# Patient Record
Sex: Male | Born: 1965 | Hispanic: No | Marital: Married | Smoking: Current every day smoker
Health system: Southern US, Community
[De-identification: ages and names within clinical notes are randomized; demographics above are authoritative.]

## PROBLEM LIST (undated history)

## (undated) DIAGNOSIS — I10 Essential (primary) hypertension: Secondary | ICD-10-CM

## (undated) DIAGNOSIS — I639 Cerebral infarction, unspecified: Secondary | ICD-10-CM

## (undated) DIAGNOSIS — E785 Hyperlipidemia, unspecified: Secondary | ICD-10-CM

## (undated) HISTORY — PX: NO PAST SURGERIES: SHX2092

---

## 2011-11-02 ENCOUNTER — Emergency Department (HOSPITAL_COMMUNITY): Payer: PRIVATE HEALTH INSURANCE

## 2011-11-02 ENCOUNTER — Inpatient Hospital Stay (HOSPITAL_COMMUNITY)
Admission: EM | Admit: 2011-11-02 | Discharge: 2011-11-03 | DRG: 066 | Disposition: A | Discharge status: Home / Self Care | Payer: PRIVATE HEALTH INSURANCE | Attending: Internal Medicine | Admitting: Internal Medicine

## 2011-11-02 DIAGNOSIS — Z91199 Patient's noncompliance with other medical treatment and regimen due to unspecified reason: Secondary | ICD-10-CM

## 2011-11-02 DIAGNOSIS — Z7982 Long term (current) use of aspirin: Secondary | ICD-10-CM

## 2011-11-02 DIAGNOSIS — I10 Essential (primary) hypertension: Secondary | ICD-10-CM | POA: Diagnosis present

## 2011-11-02 DIAGNOSIS — Z9119 Patient's noncompliance with other medical treatment and regimen: Secondary | ICD-10-CM

## 2011-11-02 DIAGNOSIS — F17200 Nicotine dependence, unspecified, uncomplicated: Secondary | ICD-10-CM | POA: Diagnosis present

## 2011-11-02 DIAGNOSIS — E119 Type 2 diabetes mellitus without complications: Secondary | ICD-10-CM | POA: Diagnosis present

## 2011-11-02 DIAGNOSIS — F1010 Alcohol abuse, uncomplicated: Secondary | ICD-10-CM | POA: Diagnosis present

## 2011-11-02 DIAGNOSIS — I6350 Cerebral infarction due to unspecified occlusion or stenosis of unspecified cerebral artery: Principal | ICD-10-CM | POA: Diagnosis present

## 2011-11-02 DIAGNOSIS — E785 Hyperlipidemia, unspecified: Secondary | ICD-10-CM | POA: Diagnosis present

## 2011-11-02 DIAGNOSIS — I639 Cerebral infarction, unspecified: Secondary | ICD-10-CM | POA: Diagnosis present

## 2011-11-02 HISTORY — DX: Essential (primary) hypertension: I10

## 2011-11-02 LAB — CBC
MCH: 33.7 pg (ref 26.0–34.0)
MCH: 33.6 pg (ref 26.0–34.0)
MCHC: 36 g/dL (ref 30.0–36.0)
MCHC: 35.6 g/dL (ref 30.0–36.0)
MCV: 93.7 fL (ref 78.0–100.0)
MCV: 94.6 fL (ref 78.0–100.0)
Platelets: 244 10*3/uL (ref 150–400)
Platelets: 262 10*3/uL (ref 150–400)
RDW: 11.7 % (ref 11.5–15.5)

## 2011-11-02 LAB — GLUCOSE, CAPILLARY: Glucose-Capillary: 325 mg/dL — ABNORMAL HIGH (ref 70–99)

## 2011-11-02 LAB — CREATININE, SERUM: Creatinine, Ser: 0.7 mg/dL (ref 0.50–1.35)

## 2011-11-02 LAB — BASIC METABOLIC PANEL
CO2: 27 mEq/L (ref 19–32)
Calcium: 10.7 mg/dL — ABNORMAL HIGH (ref 8.4–10.5)
Creatinine, Ser: 0.67 mg/dL (ref 0.50–1.35)
GFR calc non Af Amer: >90 mL/min (ref >90)
Sodium: 131 mEq/L — ABNORMAL LOW (ref 135–145)

## 2011-11-02 LAB — PROTIME-INR
INR: 0.98 (ref 0.00–1.49)
Prothrombin Time: 13.2 seconds (ref 11.6–15.2)

## 2011-11-02 LAB — COMPREHENSIVE METABOLIC PANEL
AST: 14 U/L (ref 0–37)
Albumin: 4 g/dL (ref 3.5–5.2)
BUN: 10 mg/dL (ref 6–23)
Calcium: 10 mg/dL (ref 8.4–10.5)
Chloride: 95 mEq/L — ABNORMAL LOW (ref 96–112)
Creatinine, Ser: 0.74 mg/dL (ref 0.50–1.35)
GFR calc non Af Amer: >90 mL/min (ref >90)
Total Bilirubin: 0.4 mg/dL (ref 0.3–1.2)

## 2011-11-02 LAB — HEMOGLOBIN A1C
Hgb A1c MFr Bld: 10 % — ABNORMAL HIGH (ref <5.7)
Mean Plasma Glucose: 240 mg/dL — ABNORMAL HIGH (ref <117)

## 2011-11-02 MED ORDER — SODIUM CHLORIDE 0.9 % IV BOLUS (SEPSIS)
1000.0000 mL | Freq: Once | INTRAVENOUS | Status: AC
  Administered 2011-11-02: 1000 mL via INTRAVENOUS

## 2011-11-02 MED ORDER — SODIUM CHLORIDE 0.9 % IJ SOLN: 3.0000 mL | INTRAMUSCULAR | Status: DC | PRN

## 2011-11-02 MED ORDER — SENNOSIDES-DOCUSATE SODIUM 8.6-50 MG PO TABS: 1.0000 | ORAL_TABLET | Freq: Every day | ORAL | Status: DC | PRN

## 2011-11-02 MED ORDER — ENOXAPARIN SODIUM 40 MG/0.4ML ~~LOC~~ SOLN
40.0000 mg | SUBCUTANEOUS | Status: DC
  Administered 2011-11-02: 40 mg via SUBCUTANEOUS
  Filled 2011-11-02 (×2): qty 0.4

## 2011-11-02 MED ORDER — MORPHINE SULFATE 2 MG/ML IJ SOLN: 1.0000 mg | INTRAMUSCULAR | Status: DC | PRN

## 2011-11-02 MED ORDER — SODIUM CHLORIDE 0.9 % IV SOLN
Freq: Once | INTRAVENOUS | Status: AC
  Administered 2011-11-02: 19:00:00 via INTRAVENOUS

## 2011-11-02 MED ORDER — ASPIRIN 325 MG PO TABS
325.0000 mg | ORAL_TABLET | Freq: Every day | ORAL | Status: DC
  Administered 2011-11-03: 325 mg via ORAL
  Filled 2011-11-02: qty 1

## 2011-11-02 MED ORDER — ONDANSETRON HCL 4 MG/2ML IJ SOLN: 4.0000 mg | Freq: Four times a day (QID) | INTRAMUSCULAR | Status: DC | PRN

## 2011-11-02 MED ORDER — ASPIRIN 81 MG PO CHEW
324.0000 mg | CHEWABLE_TABLET | Freq: Once | ORAL | Status: AC
  Administered 2011-11-02: 324 mg via ORAL
  Filled 2011-11-02: qty 4

## 2011-11-02 MED ORDER — ENOXAPARIN SODIUM 30 MG/0.3ML ~~LOC~~ SOLN
30.0000 mg | SUBCUTANEOUS | Status: DC
  Filled 2011-11-02: qty 0.3

## 2011-11-02 MED ORDER — ONDANSETRON HCL 4 MG PO TABS: 4.0000 mg | ORAL_TABLET | Freq: Four times a day (QID) | ORAL | Status: DC | PRN

## 2011-11-02 MED ORDER — HYDROCODONE-ACETAMINOPHEN 5-325 MG PO TABS
1.0000 | ORAL_TABLET | ORAL | Status: DC | PRN
  Administered 2011-11-02: 1 via ORAL
  Filled 2011-11-02: qty 1

## 2011-11-02 MED ORDER — SODIUM CHLORIDE 0.9 % IJ SOLN
3.0000 mL | Freq: Two times a day (BID) | INTRAMUSCULAR | Status: DC
  Administered 2011-11-02 – 2011-11-03 (×2): 3 mL via INTRAVENOUS

## 2011-11-02 MED ORDER — SODIUM CHLORIDE 0.9 % IV SOLN: 250.0000 mL | INTRAVENOUS | Status: DC

## 2011-11-02 NOTE — ED Notes (Signed)
Pt returned from MRI.  Preparing pt to go to yellow zone.

## 2011-11-02 NOTE — ED Notes (Signed)
Pt to MRI via stretcher.  Pt A/O x3, resp equal/nonlabored.  No verbal complaints at this  Time.

## 2011-11-02 NOTE — ED Notes (Signed)
Pt. Arrived via ambulance with a report of rt. Sided weakness that he woke up with.   His wife reports that he went to bed last night at 2300 and he was normal .  He is having weakness rt. Forearm and unsteady gait.  Wife believes his speech is slightly slurred.  Pt. Is alert and oriented X3, denies any pain at present time

## 2011-11-02 NOTE — ED Notes (Signed)
Report received and care assumed.  Pt sitting up in bed with no verbal complaints at this time.  Pt denies vertigo, denies headache and denies N/V.  Wife at bedside.  Awaiting CT.

## 2011-11-02 NOTE — ED Provider Notes (Signed)
Dr Tyron Russell radiology relates patient has a large left lacunar infarct extending into the periventricular white matter, no hemmorrhage noted.  PA Scheinliver notified.  Devoria Albe, MD, Armando Gang   Ward Givens, MD 11/02/11 [phone]

## 2011-11-02 NOTE — ED Provider Notes (Signed)
History     CSN: [id number] Arrival date & time: 11/02/2011  1:34 PM   None     Chief Complaint  Patient presents with  . Weakness    (Consider location/radiation/quality/duration/timing/severity/associated sxs/prior treatment) HPI History provided by pt and his wife.  Pt reports that he woke at 3am to use restroom and noticed weakness in RUE/RLE.  When woke again at 3am, he had slurred speech as well.  His wife noticed that he was unable to lift his coffee cup with his right hand and that he was ataxic.  Pt denies headache, dizziness, blurred vision, ataxia, dyphagia, paresthesias and confusion.  No prior h/o stroke and FH is unknown.  Pt smokes cigarettes, drinks 3-4 shots of liquor a day and has h/o HTN, diabetes and high cholesterol for which he has been non-compliant w/ medications for the past 3 years.   Past Medical History  Diagnosis Date  . Diabetes mellitus   . Hypertension     History reviewed. No pertinent past surgical history.  History reviewed. No pertinent family history.  History  Substance Use Topics  . Smoking status: Current Everyday Smoker -- 0.5 packs/day  . Smokeless tobacco: Not on file  . Alcohol Use: 1.5 oz/week    3 drink(s) per week      Review of Systems  All other systems reviewed and are negative.    Allergies  Review of patient's allergies indicates no known allergies.  Home Medications  No current outpatient prescriptions on file.  BP 174/95  Pulse 102  Temp(Src) 99 F (37.2 C) (Oral)  Resp 16  Ht 5\' 4"  (1.626 m)  Wt 115 lb (52.164 kg)  BMI 19.74 kg/m2  SpO2 100%  Physical Exam  Nursing note and vitals reviewed. Constitutional: He is oriented to person, place, and time. He appears well-developed and well-nourished. No distress.  HENT:  Head: Normocephalic and atraumatic.  Eyes:       Normal appearance  Neck: Normal range of motion.  Cardiovascular: Normal rate, regular rhythm and intact distal pulses.     Pulmonary/Chest: Effort normal and breath sounds normal.  Musculoskeletal: Normal range of motion.  Neurological: He is alert and oriented to person, place, and time. He has normal reflexes. He displays no tremor. No cranial nerve deficit or sensory deficit. He displays a negative Romberg sign. Coordination and gait normal.       4/5 right upper/lower extremity strength.  5/5 LUE/LLE strength.  No past pointing.  No nystagmus.  Pt can not stand independently d/t lack of balance.  Skin: Skin is warm and dry. No rash noted.  Psychiatric: He has a normal mood and affect. His behavior is normal.    ED Course  Procedures (including critical care time)  Labs Reviewed  GLUCOSE, CAPILLARY - Abnormal; Notable for the following:    Glucose-Capillary 325 (*)    All other components within normal limits  BASIC METABOLIC PANEL - Abnormal; Notable for the following:    Sodium 131 (*)    Chloride 93 (*)    Glucose, Bld 295 (*)    Calcium 10.7 (*)    All other components within normal limits  CBC   Ct Head Wo Contrast  11/02/2011  *RADIOLOGY REPORT*  Clinical Data: Right arm and leg weakness and numbness, history diabetes, hypertension  CT HEAD WITHOUT CONTRAST  Technique:  Contiguous axial images were obtained from the base of the skull through the vertex without contrast.  Comparison: Nonethe  Findings: Mild atrophy. Normal  ventricular morphology. No midline shift or mass effect. Question mild periventricular white matter lucency. An area of low attenuation involving the left thalamus extending into periventricular white matter, probably representing developing infarction. No intracranial hemorrhage. Remaining brain parenchyma normal appearance. No extra-axial fluid collection or definite mass lesion. Visualized paranasal sinuses and mastoid air cells clear. No acute osseous findings.  IMPRESSION: Atrophy with minimal white matter hypoattenuation, could represent a small vessel chronic ischemic change.  Probable area of developing infarction involving the left thalamus and periventricular white matter.  Findings called to Dr. Lynelle Doctor on 11/02/2011 at 1621 hours.  Original Report Authenticated By: Lollie Marrow, M.D.     1. Stroke       MDM  45yo M w/ h/o HTN, diabetes, hypercholesterolemia, non-compliant w/ meds, presents w/ right-sided weakness, slurred speech and ataxia. Sx onset 3am today.  Sx have persisted and are evident on exam.  Labs sig for hyperglycemia.  Pt receiving IV fluids.  CT head shows evolving infarct L thalamus.  Results discussed w/ pt.  Neuro called for consult and [facility] for admission.  Pt has received aspirin.          Arie Sabina Mooar, [place] 11/02/11 1644

## 2011-11-02 NOTE — ED Notes (Signed)
cbg is 325

## 2011-11-02 NOTE — ED Notes (Addendum)
Pt has floor bed.  Called floor to give report, spoke with unit clerk, nuerse in report and will call back for report.

## 2011-11-02 NOTE — Consult Note (Signed)
Reason for Consult:stroke   CC: stroke  HPI: Bradley Hall is an 45 y.o. male who presented to emergency after noted slurred speech right facial droop and right leg and arm weakness at 3 am this morning. Initially presented to urgent care this am but was out of window for TPA. Continues to have right sided weakness.  Past Medical History  Diagnosis Date  . Diabetes mellitus   . Hypertension     History reviewed. No pertinent past surgical history.  History reviewed. No pertinent family history.  Social History:  reports that he has been smoking.  He does smoke. He reports that he drinks about 1.5 ounces of alcohol per week. He reports that he does not use illicit drugs.  No Known Allergies  Medications:  Scheduled:   . aspirin  324 mg Oral Once  . sodium chloride  1,000 mL Intravenous Once  . sodium chloride  1,000 mL Intravenous Once    Review of Systems - General ROS: negative for - chills, fatigue, fever or hot flashes Hematological and Lymphatic ROS: negative for - bruising, fatigue, jaundice or pallor Endocrine ROS: negative for - hair pattern changes, hot flashes, mood swings or skin changes Respiratory ROS: negative for - cough, hemoptysis, orthopnea or wheezing Cardiovascular ROS: negative for - dyspnea on exertion, orthopnea, palpitations or shortness of breath Gastrointestinal ROS: negative for - abdominal pain, appetite loss, blood in stools, diarrhea or hematemesis Musculoskeletal ROS: negative for - joint pain, joint stiffness, joint swelling or muscle pain Neurological ROS: right arm and leg weakness Dermatological ROS: negative for dry skin, pruritus and rash   Blood pressure 142/87, pulse 92, temperature 98 F (36.7 C), temperature source Oral, resp. rate 16, height 5\' 2"  (1.575 m), weight 52.164 kg (115 lb), SpO2 100.00%.  Neurologic Examination: Mental Status: Alert, oriented, thought content appropriate.  Knows date is 11-02-2011. Able to follow 3 step  commands without difficulty. Cranial Nerves: II-Visual fields grossly intact. III/IV/VI-Extraocular movements intact.  Pupils reactive bilaterally. V/VII-Smile asymmetric slight facial droop on right VIII-grossly intact IX/X-normal gag XI-bilateral shoulder shrug XII-midline tongue extension Motor: 5- minus grasp, positive right arm drift, 4 deltoid strenght on right.  Right  Hip flexor 4-, knee flexion4-, knee flexion 4  On right.  Mostly proximal weakness on right.  Otherwise 5/5  Sensory: Pinprick and light touch intact throughout, bilaterally Deep Tendon Reflexes: 2+ and no ankle reflexes symmetric throughout Plantars: Downgoing bilaterally Cerebellar: Normal finger-to-nose, normal rapid alternating movements and normal heel-to-shin test.  Patient could not stand without assistance    Results for orders placed during the hospital encounter of 11/02/11 (from the past 48 hour(s))  GLUCOSE, CAPILLARY     Status: Abnormal   Collection Time   11/02/11  1:44 PM      Component Value Range Comment   Glucose-Capillary 325 (*) 70 - 99 (mg/dL)    Comment 1 Notify RN     CBC     Status: Normal   Collection Time   11/02/11  2:59 PM      Component Value Range Comment   WBC 10.3  4.0 - 10.5 (K/uL)    RBC 4.27  4.22 - 5.81 (MIL/uL)    Hemoglobin 14.4  13.0 - 17.0 (g/dL)    HCT 16.1  09.6 - 04.5 (%)    MCV 93.7  78.0 - 100.0 (fL)    MCH 33.7  26.0 - 34.0 (pg)    MCHC 36.0  30.0 - 36.0 (g/dL)    RDW  11.7  11.5 - 15.5 (%)    Platelets 244  150 - 400 (K/uL)   BASIC METABOLIC PANEL     Status: Abnormal   Collection Time   11/02/11  2:59 PM      Component Value Range Comment   Sodium 131 (*) 135 - 145 (mEq/L)    Potassium 4.2  3.5 - 5.1 (mEq/L)    Chloride 93 (*) 96 - 112 (mEq/L)    CO2 27  19 - 32 (mEq/L)    Glucose, Bld 295 (*) 70 - 99 (mg/dL)    BUN 11  6 - 23 (mg/dL)    Creatinine, Ser 1.61  0.50 - 1.35 (mg/dL)    Calcium 09.6 (*) 8.4 - 10.5 (mg/dL)    GFR calc non Af Amer >90  >90  (mL/min)    GFR calc Af Amer >90  >90 (mL/min)     Ct Head Wo Contrast  11/02/2011  *RADIOLOGY REPORT*  Clinical Data: Right arm and leg weakness and numbness, history diabetes, hypertension  CT HEAD WITHOUT CONTRAST  Technique:  Contiguous axial images were obtained from the base of the skull through the vertex without contrast.  Comparison: Nonethe  Findings: Mild atrophy. Normal ventricular morphology. No midline shift or mass effect. Question mild periventricular white matter lucency. An area of low attenuation involving the left thalamus extending into periventricular white matter, probably representing developing infarction. No intracranial hemorrhage. Remaining brain parenchyma normal appearance. No extra-axial fluid collection or definite mass lesion. Visualized paranasal sinuses and mastoid air cells clear. No acute osseous findings.  IMPRESSION: Atrophy with minimal white matter hypoattenuation, could represent a small vessel chronic ischemic change. Probable area of developing infarction involving the left thalamus and periventricular white matter.  Findings called to Dr. Lynelle Doctor on 11/02/2011 at 1621 hours.  Original Report Authenticated By: Lollie Marrow, M.D.     Assessment/Plan: Most likely Left thalamic stroke  Recommend Stroke work up:  -MRI/MRA head no contrast -2 D Echo -Carotid Doppler -Hba1c -FLP -stroke swallow screen -PT/OT/ST -ASA   Felicie Morn PA-C [facility] [phone]  11/02/2011, 4:32 PM

## 2011-11-02 NOTE — ED Notes (Signed)
Called floor to give report, spoke with Pearletha Alfred, RN.  Pt A/O x3, NAD, resp equal/nonlabored.Marland Kitchen

## 2011-11-02 NOTE — H&P (Signed)
PCP:  No primary provider on file.   DOA:  11/02/2011  1:34 PM  Chief Complaint:  Right-sided weakness  HPI: Patient is 45 year old male with no known past medical history except diabetes for which he has not been taking any medicines, presents to [facility] with main concern off right-sided weakness leg worse than arm that started the morning of admission. Patient denies similar episodes in the past and also reports associated slurred speech. He does not have anybody in her family with strokes as far as he knows. He was told at one point that he is diabetic but has not been taking any medicines at home for diabetes control. Patient denies chest pain, shortness of breath, abdominal or urinary concerns, no weakness in other parts of the body, no dizziness, no headaches or visual changes. Patient also denies recent sicknesses or hospitalizations, no fevers chills, no other systemic symptoms.  Allergies: No Known Allergies  Prior to Admission medications   Not on File    Past Medical History  Diagnosis Date  . Diabetes mellitus   . Hypertension     History reviewed. No pertinent past surgical history.  Social History:  reports that he has been smoking.  He does not have any smokeless tobacco history on file. He reports that he drinks about 1.5 ounces of alcohol per week. He reports that he does not use illicit drugs.  History reviewed. No pertinent family history.  Review of Systems:  Constitutional: Denies fever, chills, diaphoresis, appetite change and fatigue.  HEENT: Denies photophobia, eye pain, redness, hearing loss, ear pain, congestion, sore throat, rhinorrhea, sneezing, mouth sores, trouble swallowing, neck pain, neck stiffness and tinnitus.   Respiratory: Denies SOB, DOE, cough, chest tightness,  and wheezing.   Cardiovascular: Denies chest pain, palpitations and leg swelling.  Gastrointestinal: Denies nausea, vomiting, abdominal pain, diarrhea, constipation, blood  in stool and abdominal distention.  Genitourinary: Denies dysuria, urgency, frequency, hematuria, flank pain and difficulty urinating.  Musculoskeletal: Denies myalgias, back pain, joint swelling, arthralgias and gait problem.  Skin: Denies pallor, rash and wound.  Neurological: Denies dizziness, seizures, syncope, light-headedness, numbness and headaches.  Hematological: Denies adenopathy. Easy bruising, personal or family bleeding history  Psychiatric/Behavioral: Denies suicidal ideation, mood changes, confusion, nervousness, sleep disturbance and agitation   Physical Exam:  Filed Vitals:   11/02/11 1326 11/02/11 1340 11/02/11 1459 11/02/11 1532  BP: 172/111 174/95  142/87  Pulse: 96 102  92  Temp:  99 F (37.2 C) 98.6 F (37 C) 98 F (36.7 C)  TempSrc:  Oral  Oral  Resp: 16 16    Height: 5\' 4"  (1.626 m)   5\' 2"  (1.575 m)  Weight: 52.164 kg (115 lb)   52.164 kg (115 lb)  SpO2: 100% 100%  100%    Constitutional: Vital signs reviewed.  Patient is a well-developed and well-nourished in no acute distress and cooperative with exam. Alert and oriented x3.  Head: Normocephalic and atraumatic Ear: TM normal bilaterally Mouth: no erythema or exudates, MMM Eyes: PERRL, EOMI, conjunctivae normal, No scleral icterus.  Neck: Supple, Trachea midline normal ROM, No JVD, mass, thyromegaly, or carotid bruit present.  Cardiovascular: RRR, S1 normal, S2 normal, no MRG, pulses symmetric and intact bilaterally Pulmonary/Chest: CTAB, no wheezes, rales, or rhonchi Abdominal: Soft. Non-tender, non-distended, bowel sounds are normal, no masses, organomegaly, or guarding present.  GU: no CVA tenderness Musculoskeletal: No joint deformities, erythema, or stiffness, ROM full and no nontender Ext: no edema and no cyanosis, pulses palpable  bilaterally (DP and PT) Hematology: no cervical, inginal, or axillary adenopathy.  Neurological: A&O x3, strength on the left side upper and lower extremity 5 out of 5,  3/5 on the right side upper and lower extremity, sensation intact to soft touch throughout bilaterally, DTRs +2 throughout, cranial nerves II through XII grossly intact Skin: Warm, dry and intact. No rash, cyanosis, or clubbing.  Psychiatric: Normal mood and affect. speech and behavior is normal. Judgment and thought content normal. Cognition and memory are normal.   Labs on Admission:  Results for orders placed during the hospital encounter of 11/02/11 (from the past 48 hour(s))  GLUCOSE, CAPILLARY     Status: Abnormal   Collection Time   11/02/11  1:44 PM      Component Value Range Comment   Glucose-Capillary 325 (*) 70 - 99 (mg/dL)    Comment 1 Notify RN     CBC     Status: Normal   Collection Time   11/02/11  2:59 PM      Component Value Range Comment   WBC 10.3  4.0 - 10.5 (K/uL)    RBC 4.27  4.22 - 5.81 (MIL/uL)    Hemoglobin 14.4  13.0 - 17.0 (g/dL)    HCT 16.1  09.6 - 04.5 (%)    MCV 93.7  78.0 - 100.0 (fL)    MCH 33.7  26.0 - 34.0 (pg)    MCHC 36.0  30.0 - 36.0 (g/dL)    RDW 40.9  81.1 - 91.4 (%)    Platelets 244  150 - 400 (K/uL)   BASIC METABOLIC PANEL     Status: Abnormal   Collection Time   11/02/11  2:59 PM      Component Value Range Comment   Sodium 131 (*) 135 - 145 (mEq/L)    Potassium 4.2  3.5 - 5.1 (mEq/L)    Chloride 93 (*) 96 - 112 (mEq/L)    CO2 27  19 - 32 (mEq/L)    Glucose, Bld 295 (*) 70 - 99 (mg/dL)    BUN 11  6 - 23 (mg/dL)    Creatinine, Ser 7.82  0.50 - 1.35 (mg/dL)    Calcium 95.6 (*) 8.4 - 10.5 (mg/dL)    GFR calc non Af Amer >90  >90 (mL/min)    GFR calc Af Amer >90  >90 (mL/min)     Radiological Exams on Admission: No results found.  Assessment/Plan  Principal Problem:  *CVA (cerebral infarction) - this appears to be the first episode of cerebral infarction for patient. Neurology has been consulted. We will perform risk stratification, observe patient in neurology unit on telemetry, will obtain 2-D echo, carotid Dopplers, a fasting lipid  panel, A1c. Will also request PT/OT consult as well as place patient on aspirin. Active Problems:  HTN (hypertension) - monitor blood pressure and initiate medication as indicated.  HLD (hyperlipidemia) - obtain fasting lipid panel and initiate statin  Diabetes mellitus - update A1c and adjust medication regimen as indicated  Alcohol abuse - consult for sensation    Time Spent on Admission: Over 30 minutes  MAGICK-Wm Sahagun 11/02/2011, 5:17 PM

## 2011-11-02 NOTE — ED Provider Notes (Signed)
Medical screening examination/treatment/procedure(s) were performed by non-physician practitioner and as supervising physician I was immediately available for consultation/collaboration.  Raeford Razor, MD 11/02/11 [phone]

## 2011-11-03 LAB — LIPID PANEL: Cholesterol: 296 mg/dL — ABNORMAL HIGH (ref 0–200)

## 2011-11-03 LAB — BASIC METABOLIC PANEL
BUN: 11 mg/dL (ref 6–23)
CO2: 24 mEq/L (ref 19–32)
GFR calc non Af Amer: >90 mL/min (ref >90)
Glucose, Bld: 257 mg/dL — ABNORMAL HIGH (ref 70–99)
Potassium: 4.1 mEq/L (ref 3.5–5.1)
Sodium: 138 mEq/L (ref 135–145)

## 2011-11-03 LAB — CARDIAC PANEL(CRET KIN+CKTOT+MB+TROPI)
CK, MB: 2.8 ng/mL (ref 0.3–4.0)
CK, MB: 2.6 ng/mL (ref 0.3–4.0)
Total CK: 91 U/L (ref 7–232)
Total CK: 73 U/L (ref 7–232)
Troponin I: <0.3 ng/mL (ref <0.30)

## 2011-11-03 LAB — CBC
Hemoglobin: 12.8 g/dL — ABNORMAL LOW (ref 13.0–17.0)
MCH: 33 pg (ref 26.0–34.0)
MCHC: 34.6 g/dL (ref 30.0–36.0)
MCV: 95.4 fL (ref 78.0–100.0)
RBC: 3.88 MIL/uL — ABNORMAL LOW (ref 4.22–5.81)

## 2011-11-03 LAB — PROTIME-INR: Prothrombin Time: 13.6 seconds (ref 11.6–15.2)

## 2011-11-03 LAB — HEMOGLOBIN A1C: Hgb A1c MFr Bld: 9.9 % — ABNORMAL HIGH (ref <5.7)

## 2011-11-03 MED ORDER — HYDROCHLOROTHIAZIDE 25 MG PO TABS: 25.0000 mg | ORAL_TABLET | Freq: Every day | ORAL | Status: DC

## 2011-11-03 MED ORDER — HYDROCODONE-ACETAMINOPHEN 5-325 MG PO TABS: 1.0000 | ORAL_TABLET | ORAL | Status: AC | PRN

## 2011-11-03 MED ORDER — ATORVASTATIN CALCIUM 20 MG PO TABS: 20.0000 mg | ORAL_TABLET | Freq: Every day | ORAL | Status: DC

## 2011-11-03 MED ORDER — ASPIRIN 325 MG PO TABS: 325.0000 mg | ORAL_TABLET | Freq: Every day | ORAL | Status: AC

## 2011-11-03 MED ORDER — METFORMIN HCL 850 MG PO TABS: 850.0000 mg | ORAL_TABLET | Freq: Two times a day (BID) | ORAL | Status: DC

## 2011-11-03 NOTE — Progress Notes (Signed)
Pt to be D/Cd this morning.  He reports no changes in language, word-retrieval, only minor deficits in speech clarity. No formal SLP eval conducted; no SLP f/u warranted.

## 2011-11-03 NOTE — Progress Notes (Signed)
PT/OT/SLP Cancellation Note   Treatment cancelled today due to Discharge from hospital.  Pt declined pt evaluation stating no need. Alferd Apa [phone]

## 2011-11-03 NOTE — Discharge Summary (Signed)
Patient ID: Bradley Hall MRN: [id number] DOB/AGE: 06-28-1966 45 y.o.  Admit date: 11/02/2011 Discharge date: 11/03/2011  Primary Care Physician:  No primary provider on file.  Discharge Diagnoses:    Present on Admission:  .CVA (cerebral infarction) .HTN (hypertension) .HLD (hyperlipidemia) .Diabetes mellitus .Alcohol abuse  Principal Problem:  *CVA (cerebral infarction) Active Problems:  HTN (hypertension)  HLD (hyperlipidemia)  Diabetes mellitus  Alcohol abuse   Current Discharge Medication List    START taking these medications   Details  aspirin 325 MG tablet Take 1 tablet (325 mg total) by mouth daily. Qty: 31 tablet, Refills: 3    atorvastatin (LIPITOR) 20 MG tablet Take 1 tablet (20 mg total) by mouth daily. Qty: 31 tablet, Refills: 3    hydrochlorothiazide (HYDRODIURIL) 25 MG tablet Take 1 tablet (25 mg total) by mouth daily. Qty: 31 tablet, Refills: 3    HYDROcodone-acetaminophen (NORCO) 5-325 MG per tablet Take 1-2 tablets by mouth every 4 (four) hours as needed. Qty: 30 tablet, Refills: 0    metFORMIN (GLUCOPHAGE) 850 MG tablet Take 1 tablet (850 mg total) by mouth 2 (two) times daily with a meal. Qty: 62 tablet, Refills: 5        Disposition and Follow-up: Patient will followup with primary care physician in approximately 2-3 weeks. Followup appointment it will be important to check electrolyte panel as well as assess diabetes controlled with current medication regimen. Patient opts for starting with oral diabetic medications rather than insulin. He was started on 850 mg of metformin twice daily and this needs to be readjusted in outpatient setting if indicated. Diabetes education provided for patient upon discharge. Please note that fasting lipid panel is pending on discharge however patient was started on Lipitor low dose and this can be readjusted as well if indicated. Patient was also instructed to followup with neurology in approximately 4-6 weeks.  Please note that 2-D echo results are pending upon discharge.  Consults: Neurology  Significant Diagnostic Studies:  MRI HEAD:  IMPRESSION:  1. Small acute infarct involving the left thalamus and posterior  limb left internal capsule (left Thalamostriate artery territory).  No mass effect or hemorrhage.  2. Underlying age advanced nonspecific white matter signal  changes.  3. MRA findings are negative for acute events.   Brief H and P: Patient is 45 year old male with no known past medical history except diabetes for which he has not been taking any medicines, presents to [facility] with main concern off right-sided weakness leg worse than arm that started the morning of admission. Patient denies similar episodes in the past and also reports associated slurred speech. He does not have anybody in her family with strokes as far as he knows. He was told at one point that he is diabetic but has not been taking any medicines at home for diabetes control. Patient denies chest pain, shortness of breath, abdominal or urinary concerns, no weakness in other parts of the body, no dizziness, no headaches or visual changes. Patient also denies recent sicknesses or hospitalizations, no fevers chills, no other systemic symptoms.   Physical Exam on Discharge:  Filed Vitals:   11/03/11 0030 11/03/11 0232 11/03/11 0430 11/03/11 0634  BP: 104/69 117/75 116/69 114/72  Pulse: 87 89 101 70  Temp: 98.3 F (36.8 C) 98.4 F (36.9 C) 98.3 F (36.8 C) 98.4 F (36.9 C)  TempSrc:      Resp: 16 16 16 16   Height:      Weight:  SpO2: 100% 99% 96% 96%     Intake/Output Summary (Last 24 hours) at 11/03/11 0913 Last data filed at 11/03/11 0600  Gross per 24 hour  Intake    825 ml  Output    700 ml  Net    125 ml    General: Alert, awake, oriented x3, in no acute distress. HEENT: No bruits, no goiter. Heart: Regular rate and rhythm, without murmurs, rubs, gallops. Lungs: Clear to  auscultation bilaterally. Abdomen: Soft, nontender, nondistended, positive bowel sounds. Extremities: No clubbing cyanosis or edema with positive pedal pulses. Neuro: Patient is alert and oriented, strength 5 out of 5 in upper extremities bilaterally, strength 5 out of 5 in lower extremity on the left side and 4-5 out of 5 in right lower extremity. Her speech improved compared to yesterday but slightly slurred. Otherwise no changes in neurological examination from admission.  CBC:    Component Value Date/Time   WBC 7.4 11/03/2011 0600   HGB 12.8* 11/03/2011 0600   HCT 37.0* 11/03/2011 0600   PLT 244 11/03/2011 0600   MCV 95.4 11/03/2011 0600    Basic Metabolic Panel:    Component Value Date/Time   NA 138 11/03/2011 0600   K 4.1 11/03/2011 0600   CL 101 11/03/2011 0600   CO2 24 11/03/2011 0600   BUN 11 11/03/2011 0600   CREATININE 0.73 11/03/2011 0600   GLUCOSE 257* 11/03/2011 0600   CALCIUM 10.1 11/03/2011 0600    Hospital Course:  Principal Problem:  *CVA (cerebral infarction) - please note the MRI findings above. PT OT and ST was ordered upon admission. Patient desires to go home today and we will arrange outpatient followup with physical therapy continuation and patient was also notified on importance of following up with the neurologist as well as primary care physician. Risk assessment perform and currently fasting lipid panel pending as well as 2-D echo results. Active Problems:  HTN (hypertension) - patient was initially hypertensive but today he remains slightly on the soft side. He was started on HCTZ and will continue to take medication at home. I have emphasized importance of following off with primary care physician to assess blood pressure control and change medication dosing if indicated.  HLD (hyperlipidemia) - I have started the patient on low dose Diprivan since fasting lipid panel is currently pending. I have discussed goal LDL of less than 70 given patient's uncontrolled  diabetes. I have also emphasized importance of following off with primary care physician to ensure that fasting lipid panel is checked regularly and does medication is readjusted as indicated.  Diabetes mellitus - patient has not been on any prior home medications for diabetes control. During hospitalization A1c result = 10. Patient was reluctant to start insulin at this time but is willing to try oral medication. We have started patient on metformin 850 mg tablet twice daily by mouth. Patient has received diabetes education during the hospitalization.  Alcohol abuse - consult was provided for abstinence   Time spent on Discharge: Over 30 minutes  Signed: MAGICK-Annsley Akkerman 11/03/2011, 9:13 AM

## 2011-11-03 NOTE — Progress Notes (Signed)
STROKE TEAM PROGRESS NOTE SUBJECTIVE Anxious for discharge home. Bradley Hall is an 45 y.o. male who presented to emergency after noted slurred speech right facial droop and right leg and arm weakness at 3 am this morning. Initially presented to urgent care this am but was out of window for TPA. Continues to have right sided weakness. He has improved significantly but still has decreased dexterity in the right hand. He has history of diabetes hypertension and smoking but has been noncompliant with his medications  OBJECTIVE Vital signs: Temp: 98.4 F (36.9 C) (11/09 1000) Temp src: Oral (11/09 1000) BP: 147/91 mmHg (11/09 1000) Pulse Rate: 91  (11/09 1000) Respiratory Rate: 16  Intake/Output from previous day: 11/08 0701 - 11/09 0700 In: 825 [I.V.:825] Out: 700 [Urine:700]   Diet: Cardiac, thin liquids  Activity: Up with assistance  DVT Prophylaxis:  Lovenox 40 mg sq daily  Studies: CBC    Component Value Date/Time   WBC 7.4 11/03/2011 0600   RBC 3.88* 11/03/2011 0600   HGB 12.8* 11/03/2011 0600   HCT 37.0* 11/03/2011 0600   PLT 244 11/03/2011 0600   MCV 95.4 11/03/2011 0600   MCH 33.0 11/03/2011 0600   MCHC 34.6 11/03/2011 0600   RDW 12.1 11/03/2011 0600   CMP     Component Value Date/Time   NA 138 11/03/2011 0600   K 4.1 11/03/2011 0600   CL 101 11/03/2011 0600   CO2 24 11/03/2011 0600   GLUCOSE 257* 11/03/2011 0600   BUN 11 11/03/2011 0600   CREATININE 0.73 11/03/2011 0600   CALCIUM 10.1 11/03/2011 0600   PROT 7.6 11/02/2011 1656   ALBUMIN 4.0 11/02/2011 1656   AST 14 11/02/2011 1656   ALT 13 11/02/2011 1656   ALKPHOS 70 11/02/2011 1656   BILITOT 0.4 11/02/2011 1656   GFRNONAA >90 11/03/2011 0600   GFRAA >90 11/03/2011 0600    Lipid Panel     Component Value Date/Time   CHOL 296* 11/03/2011 0600   TRIG 268* 11/03/2011 0600   HDL 42 11/03/2011 0600   CHOLHDL 7.0 11/03/2011 0600   VLDL 54* 11/03/2011 0600   LDLCALC 200* 11/03/2011 0600   hgba1C    Lab Results  Component  Value Date   HGBA1C 9.9* 11/02/2011   HGBA1C 10.0* 11/02/2011    CT of the brain:    MRI of the brain: shows an acute left thalamic and posterior limb internal capsule infarct.  MRA of the brain: no large vessel intracranial stenosis  2D Echocardiogram: pending at this time  Carotid Doppler:  Pending at this time  Physical Exam:   Neurologic Examination:  Mental Status:  Alert, oriented, thought content appropriate.  Able to follow 3 step commands without difficulty.  Cranial Nerves:  II-Visual fields grossly intact.  III/IV/VI-Extraocular movements intact. Pupils reactive bilaterally.  V/VII-Smile asymmetric slight facial droop on right  VIII-grossly intact  IX/X-normal gag  XI-bilateral shoulder shrug  XII-midline tongue extension  Motor: 5- minus grasp, positive right arm drift, 4 deltoid strenght on right. Right Hip flexor 4-, knee flexion4-, knee flexion 4 On right. Mostly proximal weakness on right. Otherwise 5/5 .diminished fine finger movements on the right. Mild finger-to-nose dysmetria the right. Orbits left over right upper extremity Sensory: Pinprick and light touch intact throughout, bilaterally  Deep Tendon Reflexes: 2+ and no ankle reflexes symmetric throughout  Plantars: Downgoing bilaterally  Cerebellar: Normal finger-to-nose, normal rapid alternating movements and normal heel-to-shin test. Patient could not stand without assistance   ASSESSMENT Patient Active  Problem List  Diagnoses  . CVA (cerebral infarction)  . HTN (hypertension)  . HLD (hyperlipidemia)  . Diabetes mellitus  . Alcohol abuse   L thalamic stroke secondary to small vessel disease.multiple vascular risk factors of hypertension, diabetes, smoking and alcoholism.  Hospital day # 2  TREATMENT/PLAN Agree with discharge home. Aggressive risk factor control.aspirin for secondary prevention. Home physical and occupational therapy. Have counseled the patient quit smoking and drinking as well as  be compliant with his medications and risk factor control. Follow up with Dr. Pearlean Brownie in 2 months.   Joaquin Music, ANP-BC, GNP-BC [facility] Pager: [phone] 11/03/2011 10:58 AM

## 2011-11-08 NOTE — Progress Notes (Signed)
PT NEEDS OP PT, FAXED ORDER, REHAB WILL CALL PT WITH APPT TIME.   Bradley Hall 11/08/2011 [phone] OR [phone]

## 2011-11-13 ENCOUNTER — Ambulatory Visit: Payer: PRIVATE HEALTH INSURANCE | Admitting: Rehabilitative and Restorative Service Providers"

## 2011-12-18 ENCOUNTER — Other Ambulatory Visit: Payer: Self-pay | Admitting: Internal Medicine

## 2013-10-02 ENCOUNTER — Inpatient Hospital Stay (HOSPITAL_COMMUNITY)
Admission: EM | Admit: 2013-10-02 | Discharge: 2013-10-03 | DRG: 014 | Disposition: A | Discharge status: Home Health | Payer: BC Managed Care – PPO | Attending: Family Medicine | Admitting: Family Medicine

## 2013-10-02 ENCOUNTER — Emergency Department (HOSPITAL_COMMUNITY): Payer: BC Managed Care – PPO

## 2013-10-02 ENCOUNTER — Encounter (HOSPITAL_COMMUNITY): Payer: Self-pay | Admitting: Emergency Medicine

## 2013-10-02 ENCOUNTER — Inpatient Hospital Stay (HOSPITAL_COMMUNITY): Payer: BC Managed Care – PPO

## 2013-10-02 DIAGNOSIS — E785 Hyperlipidemia, unspecified: Secondary | ICD-10-CM | POA: Diagnosis present

## 2013-10-02 DIAGNOSIS — Z91199 Patient's noncompliance with other medical treatment and regimen due to unspecified reason: Secondary | ICD-10-CM

## 2013-10-02 DIAGNOSIS — F17200 Nicotine dependence, unspecified, uncomplicated: Secondary | ICD-10-CM | POA: Diagnosis present

## 2013-10-02 DIAGNOSIS — E119 Type 2 diabetes mellitus without complications: Secondary | ICD-10-CM

## 2013-10-02 DIAGNOSIS — IMO0001 Reserved for inherently not codable concepts without codable children: Secondary | ICD-10-CM | POA: Diagnosis present

## 2013-10-02 DIAGNOSIS — E876 Hypokalemia: Secondary | ICD-10-CM

## 2013-10-02 DIAGNOSIS — I6350 Cerebral infarction due to unspecified occlusion or stenosis of unspecified cerebral artery: Secondary | ICD-10-CM

## 2013-10-02 DIAGNOSIS — I639 Cerebral infarction, unspecified: Secondary | ICD-10-CM

## 2013-10-02 DIAGNOSIS — R4701 Aphasia: Secondary | ICD-10-CM | POA: Diagnosis present

## 2013-10-02 DIAGNOSIS — F1010 Alcohol abuse, uncomplicated: Secondary | ICD-10-CM | POA: Diagnosis present

## 2013-10-02 DIAGNOSIS — I10 Essential (primary) hypertension: Secondary | ICD-10-CM

## 2013-10-02 DIAGNOSIS — Z9119 Patient's noncompliance with other medical treatment and regimen: Secondary | ICD-10-CM

## 2013-10-02 DIAGNOSIS — R51 Headache: Secondary | ICD-10-CM | POA: Diagnosis not present

## 2013-10-02 DIAGNOSIS — I6340 Cerebral infarction due to embolism of unspecified cerebral artery: Principal | ICD-10-CM | POA: Diagnosis present

## 2013-10-02 DIAGNOSIS — Z8673 Personal history of transient ischemic attack (TIA), and cerebral infarction without residual deficits: Secondary | ICD-10-CM

## 2013-10-02 HISTORY — DX: Hyperlipidemia, unspecified: E78.5

## 2013-10-02 LAB — POCT I-STAT, CHEM 8
BUN: 10 mg/dL (ref 6–23)
Calcium, Ion: 1.21 mmol/L (ref 1.12–1.23)
Chloride: 90 mEq/L — ABNORMAL LOW (ref 96–112)
Creatinine, Ser: 1.2 mg/dL (ref 0.50–1.35)
Glucose, Bld: 235 mg/dL — ABNORMAL HIGH (ref 70–99)
HCT: 46 % (ref 39.0–52.0)
Potassium: 2.4 mEq/L — CL (ref 3.5–5.1)

## 2013-10-02 LAB — ETHANOL: Alcohol, Ethyl (B): <11 mg/dL (ref 0–11)

## 2013-10-02 LAB — DIFFERENTIAL
Basophils Absolute: 0 10*3/uL (ref 0.0–0.1)
Basophils Relative: 0 % (ref 0–1)
Eosinophils Relative: 0 % (ref 0–5)
Lymphocytes Relative: 20 % (ref 12–46)
Neutro Abs: 6.9 10*3/uL (ref 1.7–7.7)

## 2013-10-02 LAB — RAPID URINE DRUG SCREEN, HOSP PERFORMED
Amphetamines: NOT DETECTED
Barbiturates: NOT DETECTED
Cocaine: NOT DETECTED
Tetrahydrocannabinol: NOT DETECTED

## 2013-10-02 LAB — URINALYSIS, ROUTINE W REFLEX MICROSCOPIC
Bilirubin Urine: NEGATIVE
Hgb urine dipstick: NEGATIVE
Ketones, ur: NEGATIVE mg/dL
Nitrite: NEGATIVE
Protein, ur: NEGATIVE mg/dL
Specific Gravity, Urine: 1.008 (ref 1.005–1.030)
Urobilinogen, UA: 0.2 mg/dL (ref 0.0–1.0)

## 2013-10-02 LAB — COMPREHENSIVE METABOLIC PANEL
ALT: 23 U/L (ref 0–53)
AST: 24 U/L (ref 0–37)
Albumin: 4.2 g/dL (ref 3.5–5.2)
CO2: 30 mEq/L (ref 19–32)
Calcium: 9.9 mg/dL (ref 8.4–10.5)
Chloride: 88 mEq/L — ABNORMAL LOW (ref 96–112)
GFR calc non Af Amer: >90 mL/min (ref >90)
Sodium: 132 mEq/L — ABNORMAL LOW (ref 135–145)

## 2013-10-02 LAB — POCT I-STAT TROPONIN I

## 2013-10-02 LAB — GLUCOSE, CAPILLARY: Glucose-Capillary: 240 mg/dL — ABNORMAL HIGH (ref 70–99)

## 2013-10-02 LAB — CBC
Platelets: 230 10*3/uL (ref 150–400)
RDW: 12.7 % (ref 11.5–15.5)
WBC: 10.6 10*3/uL — ABNORMAL HIGH (ref 4.0–10.5)

## 2013-10-02 LAB — APTT: aPTT: 34 seconds (ref 24–37)

## 2013-10-02 LAB — PROTIME-INR: Prothrombin Time: 13 seconds (ref 11.6–15.2)

## 2013-10-02 MED ORDER — ASPIRIN 300 MG RE SUPP
300.0000 mg | Freq: Every day | RECTAL | Status: DC
  Administered 2013-10-02: 300 mg via RECTAL
  Filled 2013-10-02 (×2): qty 1

## 2013-10-02 MED ORDER — HYDROMORPHONE HCL PF 1 MG/ML IJ SOLN
INTRAMUSCULAR | Status: AC
  Administered 2013-10-02: 1 mg via INTRAVENOUS
  Filled 2013-10-02: qty 1

## 2013-10-02 MED ORDER — POTASSIUM CHLORIDE 10 MEQ/100ML IV SOLN
10.0000 meq | INTRAVENOUS | Status: AC
  Administered 2013-10-03 (×4): 10 meq via INTRAVENOUS
  Filled 2013-10-02 (×4): qty 100

## 2013-10-02 MED ORDER — ACETAMINOPHEN 650 MG RE SUPP: 650.0000 mg | RECTAL | Status: DC | PRN

## 2013-10-02 MED ORDER — ENOXAPARIN SODIUM 40 MG/0.4ML ~~LOC~~ SOLN
40.0000 mg | SUBCUTANEOUS | Status: DC
  Administered 2013-10-03: 40 mg via SUBCUTANEOUS
  Filled 2013-10-02 (×2): qty 0.4

## 2013-10-02 MED ORDER — ACETAMINOPHEN 325 MG PO TABS: 650.0000 mg | ORAL_TABLET | ORAL | Status: DC | PRN

## 2013-10-02 MED ORDER — ONDANSETRON HCL 4 MG/2ML IJ SOLN
4.0000 mg | Freq: Once | INTRAMUSCULAR | Status: AC
  Administered 2013-10-02: 4 mg via INTRAVENOUS
  Filled 2013-10-02: qty 2

## 2013-10-02 MED ORDER — LABETALOL HCL 5 MG/ML IV SOLN: 10.0000 mg | INTRAVENOUS | Status: DC | PRN

## 2013-10-02 MED ORDER — POTASSIUM CHLORIDE 10 MEQ/100ML IV SOLN
10.0000 meq | Freq: Once | INTRAVENOUS | Status: AC
  Administered 2013-10-02: 10 meq via INTRAVENOUS
  Filled 2013-10-02: qty 100

## 2013-10-02 MED ORDER — PANTOPRAZOLE SODIUM 40 MG IV SOLR
40.0000 mg | Freq: Once | INTRAVENOUS | Status: AC
  Administered 2013-10-02: 40 mg via INTRAVENOUS
  Filled 2013-10-02: qty 40

## 2013-10-02 MED ORDER — ONDANSETRON HCL 4 MG/2ML IJ SOLN: 4.0000 mg | Freq: Four times a day (QID) | INTRAMUSCULAR | Status: DC | PRN

## 2013-10-02 MED ORDER — INSULIN ASPART 100 UNIT/ML ~~LOC~~ SOLN
0.0000 [IU] | Freq: Three times a day (TID) | SUBCUTANEOUS | Status: DC
  Administered 2013-10-03 (×3): 2 [IU] via SUBCUTANEOUS

## 2013-10-02 MED ORDER — MORPHINE SULFATE 2 MG/ML IJ SOLN
2.0000 mg | INTRAMUSCULAR | Status: DC | PRN
  Administered 2013-10-02: 2 mg via INTRAVENOUS
  Filled 2013-10-02: qty 1

## 2013-10-02 NOTE — ED Notes (Signed)
Pt placed on monitor, continuous pulse oximetry and blood pressure cuff; vitals and EKG being performed

## 2013-10-02 NOTE — ED Notes (Signed)
Pt speech improved. Pt able to tell nurse he is having heartburn. MD notified.

## 2013-10-02 NOTE — ED Provider Notes (Signed)
Aryka Coonradt S 8:00 PM patient discussed in sign out.  Presenting with stroke symptoms already evaluated by neurology. Plan for admission for further stroke workup.  8:50PM Spoke with family practice resident. They will see patient and admit to telemetry bed under attending Dr. Rose Fillers.  8:55 PM nurse informing patient now having some vomiting. Will order Protonix and Zofran.  Angus Seller, PA-C 10/02/13 2055

## 2013-10-02 NOTE — ED Notes (Signed)
Patient lab results was reported to Nurse Reita Cliche.

## 2013-10-02 NOTE — ED Notes (Signed)
Pt. arrived with spouse reports aphasia with left facial droop onset 12 noon today , pt. has slurred speech at arrival with slight left facial droop.

## 2013-10-02 NOTE — ED Provider Notes (Addendum)
CSN: [id number]     Arrival date & time 10/02/13  1802 History   First MD Initiated Contact with Patient 10/02/13 1823     Chief Complaint  Patient presents with  . Aphasia  . Facial Droop   (Consider location/radiation/quality/duration/timing/severity/associated sxs/prior Treatment) The history is provided by the patient and the spouse.   patient's 47 years old. Onset of difficulty speaking at 12 noon some questionable left facial droop. Patient's spouse noted no facial changes though patient has had a history of a prior stroke has complicating factors or risk factors to include diabetes hypertension hyperlipidemia. Patient arrived was triaged was in code stroke was paged. On examination indeed the room patient was alert able to follow commands but had an expressive aphasia. Patient also with a para complaint of some pain behind both eyes and a headache. Patient was taken immediately to the CT scan her. Patient with a history of prior stroke. No significant deficit following that.  Past Medical History  Diagnosis Date  . Diabetes mellitus   . Hypertension   . Hyperlipidemia    Past Surgical History  Procedure Laterality Date  . No past surgeries     No family history on file. History  Substance Use Topics  . Smoking status: Current Every Day Smoker -- 0.50 packs/day  . Smokeless tobacco: Not on file  . Alcohol Use: 1.5 oz/week    3 drink(s) per week    Review of Systems  Constitutional: Negative for fever.  HENT: Negative for congestion.   Eyes: Positive for pain. Negative for redness.  Respiratory: Negative for shortness of breath.   Cardiovascular: Negative for chest pain.  Gastrointestinal: Negative for nausea, vomiting and abdominal pain.  Genitourinary: Negative for dysuria.  Musculoskeletal: Negative for back pain.  Skin: Negative for rash.  Neurological: Positive for speech difficulty and headaches. Negative for seizures, weakness and numbness.  Hematological: Does  not bruise/bleed easily.  Psychiatric/Behavioral: Negative for confusion.    Allergies  Review of patient's allergies indicates no known allergies.  Home Medications   Current Outpatient Rx  Name  Route  Sig  Dispense  Refill  . EXPIRED: atorvastatin (LIPITOR) 20 MG tablet   Oral   Take 1 tablet (20 mg total) by mouth daily.   31 tablet   3   . EXPIRED: hydrochlorothiazide (HYDRODIURIL) 25 MG tablet   Oral   Take 1 tablet (25 mg total) by mouth daily.   31 tablet   3   . EXPIRED: metFORMIN (GLUCOPHAGE) 850 MG tablet   Oral   Take 1 tablet (850 mg total) by mouth 2 (two) times daily with a meal.   62 tablet   5    BP 191/99  Pulse 96  Temp(Src) 99 F (37.2 C) (Oral)  Resp 15  SpO2 100% Physical Exam  Nursing note and vitals reviewed. Constitutional: He is oriented to person, place, and time. He appears well-developed and well-nourished.  HENT:  Head: Normocephalic and atraumatic.  Mouth/Throat: Oropharynx is clear and moist.  Eyes: Conjunctivae and EOM are normal. Pupils are equal, round, and reactive to light.  Neck: Normal range of motion.  Cardiovascular: Normal rate, regular rhythm and normal heart sounds.   No murmur heard. Pulmonary/Chest: Effort normal and breath sounds normal. No respiratory distress.  Abdominal: Soft. Bowel sounds are normal. There is no tenderness.  Musculoskeletal: Normal range of motion. He exhibits no edema.  Neurological: He is alert and oriented to person, place, and time. A cranial nerve deficit  is present. He exhibits normal muscle tone. Coordination normal.  No focal neural deficit other than expressive aphasia. Patient understands commands fine is able to follow them.  Skin: Skin is warm. No rash noted.    ED Course  Procedures (including critical care time) Labs Review Labs Reviewed  GLUCOSE, CAPILLARY - Abnormal; Notable for the following:    Glucose-Capillary 240 (*)    All other components within normal limits  POCT  I-STAT, CHEM 8 - Abnormal; Notable for the following:    Potassium 2.4 (*)    Chloride 90 (*)    Glucose, Bld 235 (*)    All other components within normal limits  ETHANOL  PROTIME-INR  APTT  CBC  DIFFERENTIAL  COMPREHENSIVE METABOLIC PANEL  TROPONIN I  URINE RAPID DRUG SCREEN (HOSP PERFORMED)  URINALYSIS, ROUTINE W REFLEX MICROSCOPIC   Results for orders placed during the hospital encounter of 10/02/13  GLUCOSE, CAPILLARY      Result Value Range   Glucose-Capillary 240 (*) 70 - 99 mg/dL  POCT I-STAT, CHEM 8      Result Value Range   Sodium 135  135 - 145 mEq/L   Potassium 2.4 (*) 3.5 - 5.1 mEq/L   Chloride 90 (*) 96 - 112 mEq/L   BUN 10  6 - 23 mg/dL   Creatinine, Ser 7.82  0.50 - 1.35 mg/dL   Glucose, Bld 956 (*) 70 - 99 mg/dL   Calcium, Ion 2.13  0.86 - 1.23 mmol/L   TCO2 29  0 - 100 mmol/L   Hemoglobin 15.6  13.0 - 17.0 g/dL   HCT 57.8  46.9 - 62.9 %   Comment NOTIFIED PHYSICIAN      Imaging Review Ct Head Wo Contrast  10/02/2013   CLINICAL DATA:  Code stroke, a aphasia and left facial droop  EXAM: CT HEAD WITHOUT CONTRAST  TECHNIQUE: Contiguous axial images were obtained from the base of the skull through the vertex without intravenous contrast.  COMPARISON:  Prior CT head 11/02/2011; prior MRI brain 11/02/2011  FINDINGS: Negative for acute intracranial hemorrhage, acute infarction, mass, mass effect, hydrocephalus or midline shift. Gray-white differentiation is preserved throughout. A well-circumscribed hypoattenuation centered in the posterior left basal ganglia consistent with evolution of a prior infarct. Global cerebral volume loss appears advanced for age. Mild periventricular white matter hypoattenuation most consistent with a sequela of longstanding microvascular ischemic disease. Globes and orbits are intact and unremarkable. No acute soft tissue or calvarial abnormality. Normal aeration of the mastoid air cells and paranasal sinuses. Trace atherosclerotic  calcification in the bilateral cavernous carotid arteries.  IMPRESSION: 1. No acute intracranial abnormality. 2. Remote lacunar infarct in the left basal ganglia 3. Atherosclerotic calcifications in the bilateral cavernous carotid arteries.   Electronically Signed   By: Malachy Moan M.D.   On: 10/02/2013 18:23    EKG Interpretation   None      CRITICAL CARE Performed by: Shelda Jakes. Total critical care time: 30 Critical care time was exclusive of separately billable procedures and treating other patients. Critical care was necessary to treat or prevent imminent or life-threatening deterioration. Critical care was time spent personally by me on the following activities: development of treatment plan with patient and/or surrogate as well as nursing, discussions with consultants, evaluation of patient's response to treatment, examination of patient, obtaining history from patient or surrogate, ordering and performing treatments and interventions, ordering and review of laboratory studies, ordering and review of radiographic studies, pulse oximetry and re-evaluation of patient's  condition.  MDM   1. CVA (cerebral infarction)   2. Hypokalemia    Patient with onset of expressive aphasia at 12 noon today. Patient's spouse was present witnessed the whole thing. Has had a stroke in the past. A delayed film coming in because patient did not want to come in. Patient able to understand and follow commands fine. Here in the ED no evidence of any arm or leg weakness or any facial weakness. The patient was made a code stroke. Head CT without acute findings. On-call neurology involved. They will admit the patient.  Patient's potassium significantly low at 2.4 will require IV potassium for correction. Patient's potassium 10 mEq ordered x2 IV piggyback.     Shelda Jakes, MD 10/02/13 1610  Shelda Jakes, MD 10/02/13 Barry Brunner  Shelda Jakes, MD 10/02/13 [phone]

## 2013-10-02 NOTE — ED Notes (Signed)
Paged Code Stroke 

## 2013-10-02 NOTE — Consult Note (Signed)
Referring Physician: ED    Chief Complaint: CODE STROKE: APHASIA  HPI:                                                                                                                                         Bradley Hall is an 47 y.o. male, right handed, with a past medical history significant for HTN, DM type 2, hyperlipidemia, heavy smoker, stroke 2012, comes in today due to language impairment. He is accompanied by his wife. Last known well at 12 pm today, when he complained of having a severe HA in the right side of the head and then his wife noticed that he was having difficulty expressing himself. She tells me that she was having a hard time understanding what he was trying to say, " it is like he is taking in a different language". Did not notice face droopiness, imbalance, or focal weakness. He is not taking his aspirin as he was supposed to and continues smoking. NIHSS 3 CT brain showed no acute intracranial abnormality.  Date last known well:  Time last known well:  tPA Given: no, late presentation NIHSS: 4 MRS: 3  Past Medical History  Diagnosis Date  . Diabetes mellitus   . Hypertension   . Hyperlipidemia     Past Surgical History  Procedure Laterality Date  . No past surgeries      No family history on file. Social History:  reports that he has been smoking.  He does not have any smokeless tobacco history on file. He reports that he drinks about 1.5 ounces of alcohol per week. He reports that he does not use illicit drugs.  Allergies: No Known Allergies  Medications:                                                                                                                           I have reviewed the patient's current medications.  ROS:  Unable to obtain due to aphasia.  History obtained from unobtainable from patient due to  aphasia. Wife provided history.    Physical exam: pleasant male in no apparent distress. Blood pressure 191/99, pulse 96, temperature 99 F (37.2 C), temperature source Oral, resp. rate 15, SpO2 100.00%. Head: normocephalic. Neck: supple, no bruits, no JVD. Cardiac: no murmurs. Lungs: clear. Abdomen: soft, no tender, no mass. Extremities: no edema.  Neurologic Examination:                                                                                                      Mental Status: Alert, oriented, thought content appropriate.  Expressive greater than receptive dysphasia.  Cranial Nerves: II: Discs flat bilaterally; Visual fields grossly normal, pupils equal, round, reactive to light and accommodation III,IV, VI: ptosis not present, extra-ocular motions intact bilaterally V,VII: smile symmetric, facial light touch sensation normal bilaterally VIII: hearing normal bilaterally IX,X: gag reflex present XI: bilateral shoulder shrug XII: midline tongue extension Motor: Right : Upper extremity   5/5    Left:     Upper extremity   5/5  Lower extremity   5/5     Lower extremity   5/5 Tone and bulk:normal tone throughout; no atrophy noted Sensory: Pinprick and light touch intact throughout, bilaterally Deep Tendon Reflexes:  Right: Upper Extremity   Left: Upper extremity   biceps (C-5 to C-6) 2/4   biceps (C-5 to C-6) 2/4 tricep (C7) 2/4    triceps (C7) 2/4 Brachioradialis (C6) 2/4  Brachioradialis (C6) 2/4  Lower Extremity Lower Extremity  quadriceps (L-2 to L-4) 2/4   quadriceps (L-2 to L-4) 2/4 Achilles (S1) 2/4   Achilles (S1) 2/4  Plantars: Right: upgoing   Left: upgoing Cerebellar: normal finger-to-nose,  normal heel-to-shin test Gait: No ataxia. CV: pulses palpable throughout    Results for orders placed during the hospital encounter of 10/02/13 (from the past 48 hour(s))  GLUCOSE, CAPILLARY     Status: Abnormal   Collection Time    10/02/13  6:36 PM       Result Value Range   Glucose-Capillary 240 (*) 70 - 99 mg/dL   Ct Head Wo Contrast  10/02/2013   CLINICAL DATA:  Code stroke, a aphasia and left facial droop  EXAM: CT HEAD WITHOUT CONTRAST  TECHNIQUE: Contiguous axial images were obtained from the base of the skull through the vertex without intravenous contrast.  COMPARISON:  Prior CT head 11/02/2011; prior MRI brain 11/02/2011  FINDINGS: Negative for acute intracranial hemorrhage, acute infarction, mass, mass effect, hydrocephalus or midline shift. Gray-white differentiation is preserved throughout. A well-circumscribed hypoattenuation centered in the posterior left basal ganglia consistent with evolution of a prior infarct. Global cerebral volume loss appears advanced for age. Mild periventricular white matter hypoattenuation most consistent with a sequela of longstanding microvascular ischemic disease. Globes and orbits are intact and unremarkable. No acute soft tissue or calvarial abnormality. Normal aeration of the mastoid air cells and paranasal sinuses. Trace atherosclerotic calcification in the bilateral cavernous carotid arteries.  IMPRESSION: 1. No acute intracranial abnormality. 2. Remote lacunar infarct in the left basal ganglia 3.  Atherosclerotic calcifications in the bilateral cavernous carotid arteries.   Electronically Signed   By: Malachy Moan M.D.   On: 10/02/2013 18:23       Assessment: 47 y.o. male with acute dysphasia. NIHSS 3. He is out of the window for IV thrombolysis. Will admit to medicine. Aspirin. Complete stroke work up.  Stroke Risk Factors - HTN, DM type 2, hyperlipidemia, heavy smoker, stroke  Plan: 1. HgbA1c, fasting lipid panel 2. MRI, MRA  of the brain without contrast 3. Echocardiogram 4. Carotid dopplers 5. Prophylactic therapy-aspirin 81 mg daily 6. Risk factor modification 7. Telemetry monitoring 8. Frequent neuro checks 9. PT/OT SLP  Wyatt Portela ,MD [facility] [phone]  10/02/2013, 6:42 PM

## 2013-10-02 NOTE — ED Notes (Signed)
When nurse at bedside, patient sat up to talk on phone and threw up brownish orangish emesis. Md notified, pt states he is having heartburn and hungry. Orders obtained. Medicated at this time.

## 2013-10-02 NOTE — ED Notes (Signed)
Pt shirt, shoes, phone placed in pt belongings bag. PT glasses on self. Pt still asking for food. Pt told he cannot eat at this time.

## 2013-10-02 NOTE — ED Notes (Signed)
Internal Med at bedside evaluating patient.

## 2013-10-02 NOTE — ED Notes (Signed)
MD aware of high blood pressures.

## 2013-10-03 ENCOUNTER — Inpatient Hospital Stay (HOSPITAL_COMMUNITY): Payer: BC Managed Care – PPO

## 2013-10-03 DIAGNOSIS — I6789 Other cerebrovascular disease: Secondary | ICD-10-CM

## 2013-10-03 LAB — BASIC METABOLIC PANEL
Calcium: 9.8 mg/dL (ref 8.4–10.5)
Creatinine, Ser: 0.76 mg/dL (ref 0.50–1.35)
GFR calc Af Amer: >90 mL/min (ref >90)
GFR calc non Af Amer: >90 mL/min (ref >90)
Potassium: 3.8 mEq/L (ref 3.5–5.1)
Sodium: 133 mEq/L — ABNORMAL LOW (ref 135–145)

## 2013-10-03 LAB — TSH: TSH: 0.477 u[IU]/mL (ref 0.350–4.500)

## 2013-10-03 LAB — HEMOGLOBIN A1C: Mean Plasma Glucose: 186 mg/dL — ABNORMAL HIGH (ref <117)

## 2013-10-03 LAB — GLUCOSE, CAPILLARY
Glucose-Capillary: 183 mg/dL — ABNORMAL HIGH (ref 70–99)
Glucose-Capillary: 176 mg/dL — ABNORMAL HIGH (ref 70–99)

## 2013-10-03 LAB — CREATININE, SERUM
Creatinine, Ser: 0.67 mg/dL (ref 0.50–1.35)
GFR calc non Af Amer: >90 mL/min (ref >90)

## 2013-10-03 LAB — CBC
HCT: 39.7 % (ref 39.0–52.0)
MCH: 34.7 pg — ABNORMAL HIGH (ref 26.0–34.0)
MCHC: 36.8 g/dL — ABNORMAL HIGH (ref 30.0–36.0)
Platelets: 246 10*3/uL (ref 150–400)
RDW: 12.8 % (ref 11.5–15.5)
WBC: 11.2 10*3/uL — ABNORMAL HIGH (ref 4.0–10.5)

## 2013-10-03 LAB — LIPID PANEL
Cholesterol: 265 mg/dL — ABNORMAL HIGH (ref 0–200)
HDL: 57 mg/dL (ref >39)
Triglycerides: 115 mg/dL (ref <150)
VLDL: 23 mg/dL (ref 0–40)

## 2013-10-03 MED ORDER — ATORVASTATIN CALCIUM 80 MG PO TABS: 80.0000 mg | ORAL_TABLET | Freq: Every day | ORAL | Status: DC

## 2013-10-03 MED ORDER — ASPIRIN EC 81 MG PO TBEC: 81.0000 mg | DELAYED_RELEASE_TABLET | Freq: Every day | ORAL | Status: DC

## 2013-10-03 MED ORDER — INFLUENZA VAC SPLIT QUAD 0.5 ML IM SUSP: 0.5000 mL | INTRAMUSCULAR | Status: DC

## 2013-10-03 MED ORDER — ATORVASTATIN CALCIUM 40 MG PO TABS
40.0000 mg | ORAL_TABLET | Freq: Every day | ORAL | Status: DC
  Filled 2013-10-03: qty 1

## 2013-10-03 NOTE — Progress Notes (Signed)
Stroke Team Progress Note  HISTORY Bradley Hall is an 47 y.o. male, right handed, with a past medical history significant for HTN, DM type 2, hyperlipidemia, heavy smoker, stroke 2012, comes in today due to language impairment. He is accompanied by his wife.  Last known well at 12 pm today, when he complained of having a severe HA in the right side of the head and then his wife noticed that he was having difficulty expressing himself. She tells me that she was having a hard time understanding what he was trying to say, " it is like he is taking in a different language".   Did not notice face droopiness, imbalance, or focal weakness.  He is not taking his aspirin as he was supposed to and continues smoking.  NIHSS 3   CT brain showed no acute intracranial abnormality.   Date last known well:  Time last known well:  tPA Given: no, late presentation  NIHSS: 4  MRS: 3   He was admitted for further evaluation and treatment.  SUBJECTIVE He is lying in bed. Remains aphasic.  OBJECTIVE Most recent Vital Signs: Filed Vitals:   10/03/13 0214 10/03/13 0441 10/03/13 0700 10/03/13 0701  BP: 186/97 172/97  173/94  Pulse: 109 100  102  Temp: 99.3 F (37.4 C) 99.4 F (37.4 C) 99.2 F (37.3 C) 99.2 F (37.3 C)  TempSrc: Oral Oral  Oral  Resp: 20 20  20   Height:      Weight:      SpO2: 96% 85%  98%   CBG (last 3)   Recent Labs  10/02/13 1836 10/03/13 0658  GLUCAP 240* 183*    IV Fluid Intake:     MEDICATIONS  . aspirin  300 mg Rectal Daily  . enoxaparin (LOVENOX) injection  40 mg Subcutaneous Q24H  . [START ON 10/04/2013] influenza vac split quadrivalent PF  0.5 mL Intramuscular Tomorrow-1000  . insulin aspart  0-9 Units Subcutaneous TID WC   PRN:  acetaminophen, acetaminophen, labetalol, morphine injection, ondansetron (ZOFRAN) IV  Diet:  Carb Control thin liquids Activity:  Bedrest DVT Prophylaxis:  lovenox   CLINICALLY SIGNIFICANT STUDIES Basic Metabolic Panel:   Recent Labs Lab 10/02/13 1830 10/02/13 1848 10/03/13 0600  NA 132* 135 133*  K 2.8* 2.4* 3.8  CL 88* 90* 90*  CO2 30  --  30  GLUCOSE 228* 235* 191*  BUN 11 10 6   CREATININE 0.83 1.20 0.76  CALCIUM 9.9  --  9.8   Liver Function Tests:  Recent Labs Lab 10/02/13 1830  AST 24  ALT 23  ALKPHOS 49  BILITOT 0.5  PROT 7.3  ALBUMIN 4.2   CBC:  Recent Labs Lab 10/02/13 0040 10/02/13 1830 10/02/13 1848  WBC 11.2* 10.6*  --   NEUTROABS  --  6.9  --   HGB 14.6 14.7 15.6  HCT 39.7 40.0 46.0  MCV 94.3 94.8  --   PLT 246 230  --    Coagulation:  Recent Labs Lab 10/02/13 1830  LABPROT 13.0  INR 1.00   Cardiac Enzymes:  Recent Labs Lab 10/02/13 1830  TROPONINI <0.30   Urinalysis:  Recent Labs Lab 10/02/13 1949  COLORURINE YELLOW  LABSPEC 1.008  PHURINE 7.5  GLUCOSEU 100*  HGBUR NEGATIVE  BILIRUBINUR NEGATIVE  KETONESUR NEGATIVE  PROTEINUR NEGATIVE  UROBILINOGEN 0.2  NITRITE NEGATIVE  LEUKOCYTESUR NEGATIVE   Lipid Panel    Component Value Date/Time   CHOL 265* 10/03/2013 0040   TRIG 115 10/03/2013 0040  HDL 57 10/03/2013 0040   CHOLHDL 4.6 10/03/2013 0040   VLDL 23 10/03/2013 0040   LDLCALC 185* 10/03/2013 0040   HgbA1C  Lab Results  Component Value Date   HGBA1C 8.1* 10/02/2013    Urine Drug Screen:     Component Value Date/Time   LABOPIA NONE DETECTED 10/02/2013 1949   COCAINSCRNUR NONE DETECTED 10/02/2013 1949   LABBENZ NONE DETECTED 10/02/2013 1949   AMPHETMU NONE DETECTED 10/02/2013 1949   THCU NONE DETECTED 10/02/2013 1949   LABBARB NONE DETECTED 10/02/2013 1949    Alcohol Level:  Recent Labs Lab 10/02/13 1830  ETH <11    Dg Chest 2 View  10/03/2013   *RADIOLOGY REPORT*  Clinical Data: CVA; history of smoking.  CHEST - 2 VIEW  Comparison: None.  Findings: The lungs are well-aerated and clear.  There is no evidence of focal opacification, pleural effusion or pneumothorax.  The heart is normal in size; the mediastinal contour is  within normal limits.  No acute osseous abnormalities are seen.  IMPRESSION: No acute cardiopulmonary process seen.   Original Report Authenticated By: Tonia Ghent, M.D.   Ct Head Wo Contrast  10/02/2013   CLINICAL DATA:  Code stroke, a aphasia and left facial droop  EXAM: CT HEAD WITHOUT CONTRAST  TECHNIQUE: Contiguous axial images were obtained from the base of the skull through the vertex without intravenous contrast.  COMPARISON:  Prior CT head 11/02/2011; prior MRI brain 11/02/2011  FINDINGS: Negative for acute intracranial hemorrhage, acute infarction, mass, mass effect, hydrocephalus or midline shift. Gray-white differentiation is preserved throughout. A well-circumscribed hypoattenuation centered in the posterior left basal ganglia consistent with evolution of a prior infarct. Global cerebral volume loss appears advanced for age. Mild periventricular white matter hypoattenuation most consistent with a sequela of longstanding microvascular ischemic disease. Globes and orbits are intact and unremarkable. No acute soft tissue or calvarial abnormality. Normal aeration of the mastoid air cells and paranasal sinuses. Trace atherosclerotic calcification in the bilateral cavernous carotid arteries.  IMPRESSION: 1. No acute intracranial abnormality. 2. Remote lacunar infarct in the left basal ganglia 3. Atherosclerotic calcifications in the bilateral cavernous carotid arteries.   Electronically Signed   By: Malachy Moan M.D.   On: 10/02/2013 18:23   Mr Maxine Glenn Head Wo Contrast  10/03/2013   CLINICAL DATA:  Expressive aphasia. Hypertension and diabetes mellitus. Headache.  EXAM: MRI HEAD WITHOUT CONTRAST  MRA HEAD WITHOUT CONTRAST  TECHNIQUE: Multiplanar, multiecho pulse sequences of the brain and surrounding structures were obtained without intravenous contrast. Angiographic images of the head were obtained using MRA technique without contrast.  COMPARISON:  CT head 10/02/2013.  FINDINGS: MRI HEAD FINDINGS   The patient could not cooperate for the entire exam. He removed himself from the scanner after completion of sagittal T1 and axial diffusion imaging.  Motion degraded examination demonstrates what appears to be an acute subcentimeter infarct affecting the periventricular deep white matter on the left (image 17 series 5). An area of lower level gyriform restricted diffusion is seen in the left occipital cortex, possible subacute infarction.  Premature atrophy. No midline abnormality. No obvious extra-axial fluid collection or mass lesion. No obvious acute hemorrhage.  MRA HEAD FINDINGS  The internal carotid arteries are widely patent. The basilar artery is widely patent with both vertebrals contributing. There is moderate to severe disease of both proximal anterior cerebral arteries, worse on the right. There is no right MCA stenosis. The left MCA shows mild non stenotic irregularity proximally. No visible PCA  stenosis. No cerebellar branch occlusion. No intracranial aneurysm.  Moderate irregularity distal MCA and PCA branches suggests intracranial atherosclerotic change.  IMPRESSION: MRI HEAD IMPRESSION  Suspected acute subcentimeter infarct affecting the periventricular deep white matter on the left just above and medial to the posterior limb internal capsule. Lower level gyriform like restricted diffusion left occipital lobe could represent a 2nd area of subacute infarction.  MRA HEAD IMPRESSION  Moderate to severe disease of both proximal anterior cerebral arteries, worse on the right. No proximal ICA or MCA stenosis. Distal MCA and PCA disease suggesting intracranial atherosclerotic change.   Electronically Signed   By: Davonna Belling M.D.   On: 10/03/2013 07:49    CT of the brain    MRI of the brain    MRA of the brain    2D Echocardiogram    Carotid Doppler Bilateral: 1-39% ICA stenosis. Vertebral artery flow is antegrade   CXR    EKG  normal sinus rhythm.   Therapy Recommendations   Physical  Exam   Mental Status:  Alert, oriented, thought content appropriate. Expressive greater than receptive dysphasia.  Cranial Nerves:  II: Discs flat bilaterally; Visual fields grossly normal, pupils equal, round, reactive to light and accommodation  III,IV, VI: ptosis not present, extra-ocular motions intact bilaterally  V,VII: smile symmetric, facial light touch sensation normal bilaterally  VIII: hearing normal bilaterally  IX,X: gag reflex present  XI: bilateral shoulder shrug  XII: midline tongue extension  Motor:  Right : Upper extremity 5/5 Left: Upper extremity 5/5  Lower extremity 5/5 Lower extremity 5/5  Tone and bulk:normal tone throughout; no atrophy noted  Sensory: Pinprick and light touch intact throughout, bilaterally  Deep Tendon Reflexes:  Right: Upper Extremity Left: Upper extremity  biceps (C-5 to C-6) 2/4 biceps (C-5 to C-6) 2/4  tricep (C7) 2/4 triceps (C7) 2/4  Brachioradialis (C6) 2/4 Brachioradialis (C6) 2/4  Lower Extremity Lower Extremity  quadriceps (L-2 to L-4) 2/4 quadriceps (L-2 to L-4) 2/4  Achilles (S1) 2/4 Achilles (S1) 2/4  Plantars:  Right: upgoing Left: upgoing  Cerebellar:  normal finger-to-nose, normal heel-to-shin test  Gait:  No ataxia.  CV: pulses palpable throughout    ASSESSMENT Mr. Naeem Quillin is a 47 y.o. male presenting with aphasia, field cut. Imaging confirms an acute infarct of periventricular deep white matter on the left just above and medical to the posterior limb of the internal capsule. In addition, another area of concern is left occipital lobe infarct. Infarct felt to be embolic secondary to unknown.  On no antithrombotics prior to admission. Now on rectal aspirin 300mg  daily  for secondary stroke prevention. Patient with resultant aphasia. Work up underway.   Hyperlipidmia, LDL 185, goal < 70 in diabetics, add back statin at increased dose  Diabetes mellitus, type 2, uncontrolled,  HGB A1C    8.1  Hypertension   Hospital day # 1  TREATMENT/PLAN  Continue rectal aspirin 300mg  daily for secondary stroke prevention.  Risk factor modification  Statin added  Await therapy evaluations TEE to look for embolic source. Please arrange with pts cardiologist or cardiologist of choice. If positive for PFO (patent foramen ovale), check bilateral lower extremity venous dopplers to rule out DVT as possible source of stroke. IF PATIENT IS DISCHARGED OVER WEEKEND: have patient follow up for outpatient TEE.  Gwendolyn Lima. Manson Passey, [facility], MBA, MHA [facility] Pager: [phone] 10/03/2013 2:22 PM   I have personally obtained a history, examined the patient, evaluated imaging results, and formulated  the assessment and plan of care. I agree with the above. Antony Contras, MD

## 2013-10-03 NOTE — H&P (Signed)
Family Medicine Teaching [facility] Admission History and Physical Service Pager: [phone]  Patient name: Bradley Hall Medical record number: [id number] Date of birth: [birth year] Age: 47 y.o. Gender: male  Primary Care Provider: No primary provider on file. Consultants: Neurology Code Status: Full  Chief Complaint: Expressive aphasia  Assessment and Plan: Bradley Hall is a 47 y.o. male presenting with expressive aphasia. PMH is significant for prior CVA, DM, HTN, HLP, and tobacco use.   # Expressive Aphasia presumed secondary CVA: presenting outside window for tPA. H/o CVA Nov 2012; Patient evaluated by neurologist upon presentation who presumed stroke; Differential includes complex migraine - Risk factors include: DM, HTN, hyperlipidemia, tobacco use - ASA 300mg  PR since patient not taking consistently after previous CVA - NPO pending speech therapy evaluation  - Neuro checks q2h - Permissive HTN - MRI Brain, MRA Head - 2D ECHO  - Carotid doppler - Fasting lipid panel, increase Lipitor to 80 mg when tolerating PO - PT/OT  # Headache: Possibly a migraine with unilateral retro-orbital pain, nausea with emesis, and photophobia.  - Morphine 2mg  q2h prn pain - Zofran 4mg  q6h prn nausea  # Hypokalemia: K: 2.4. Given KCl x1 in ED - Continue to replete with 4 additional runs of KCl - Recheck BMP in AM  # T2DM:  - CBG monitoring qAC/HS - Sensitive SSI - Holding home metformin 850mg  BID  # HTN - Allowing permissive HTN  - Labetalol prn BP >220/110 - Holding home HCTZ 25mg   # Hyperlipidemia - Fasting lipid panel in AM - On lipitor 20mg  per records  FEN/GI: NPO pending SLP evaluation Prophylaxis: Lovenox 40mg  daily, protonix 40mg   Disposition: Admit to [facility] Medicine Teaching Service with telemetry, attending Dr. Lum Babe  History of Present Illness: Bradley Hall is a 47 y.o. male presenting with expressive aphasia that began around noon today.   He also had a  severe headache at that time and his wife accompanied him to the ED. A code stroke was called and tPA was not given as it was outside the window for administration. In the ED he had one episode of emesis, and reports heartburn. He has a severe headache currently located behind the left eye associated with nausea and photophobia. Denies SOB, CP. He reports adhering to his home medications including aspirin.  Some history is taken from the medical record, as pt is unaccompanied at time of interview and has limited ability to communicate history.   Review Of Systems: Per HPI Otherwise 12 point review of systems was performed and was unremarkable.  Patient Active Problem List   Diagnosis Date Noted  . CVA (cerebral infarction) 11/02/2011  . HTN (hypertension) 11/02/2011  . HLD (hyperlipidemia) 11/02/2011  . Diabetes mellitus 11/02/2011  . Alcohol abuse 11/02/2011   Past Medical History: Past Medical History  Diagnosis Date  . Diabetes mellitus   . Hypertension   . Hyperlipidemia    Past Surgical History: Past Surgical History  Procedure Laterality Date  . No past surgeries     Social History: History  Substance Use Topics  . Smoking status: Current Every Day Smoker -- 0.50 packs/day  . Smokeless tobacco: Not on file  . Alcohol Use: 1.5 oz/week    3 drink(s) per week   Additional social history: Married.   Please also refer to relevant sections of EMR.  Family History: No family history on file. Allergies and Medications: No Known Allergies No current facility-administered medications on file prior to encounter.   Current Outpatient Prescriptions on  File Prior to Encounter  Medication Sig Dispense Refill  . atorvastatin (LIPITOR) 20 MG tablet Take 1 tablet (20 mg total) by mouth daily.  31 tablet  3  . hydrochlorothiazide (HYDRODIURIL) 25 MG tablet Take 1 tablet (25 mg total) by mouth daily.  31 tablet  3  . metFORMIN (GLUCOPHAGE) 850 MG tablet Take 1 tablet (850 mg total)  by mouth 2 (two) times daily with a meal.  62 tablet  5    Objective: BP 183/92  Pulse 89  Temp(Src) 98.9 F (37.2 C) (Oral)  Resp 18  SpO2 99% Exam: General: Alert 47yo male sitting in bed in NAD HEENT: NCAT, sclerae normal, no papilledema, oropharynx clear Cardiovascular: RRR no murmurs, rubs, gallops, no edema, 2+ DP pulses.  Respiratory: CTAB, non-labored Abdomen: Soft, NT, ND Normoactive BS Extremities: WWP, normal muscle bulk Skin: No rashes, lesions, bruising noted Neuro: AOx3, expressive aphasia, follows commands, CN II-XII intact, limited visual fields to confrontation, gait not assessed, no focal weakness, DTRs 1/4 in bilateral lower extremities, normal sensation.   Labs and Imaging: CBC BMET   Recent Labs Lab 10/02/13 1830 10/02/13 1848  WBC 10.6*  --   HGB 14.7 15.6  HCT 40.0 46.0  PLT 230  --     Recent Labs Lab 10/02/13 1830 10/02/13 1848  NA 132* 135  K 2.8* 2.4*  CL 88* 90*  CO2 30  --   BUN 11 10  CREATININE 0.83 1.20  GLUCOSE 228* 235*  CALCIUM 9.9  --     INR: 1.00 UDS: negative EtOH: <11 U/A: negative Troponin negative x2 ECG: NSR, normal rate, no ST changes   CT HEAD WITHOUT CONTRAST  FINDINGS: Negative for acute intracranial hemorrhage, acute infarction, mass, mass effect, hydrocephalus or midline shift. Gray-white differentiation is preserved throughout. A well-circumscribed hypoattenuation centered in the posterior left basal ganglia consistent with evolution of a prior infarct. Global cerebral volume loss appears advanced for age. Mild periventricular white matter hypoattenuation most consistent with a sequela of longstanding microvascular ischemic disease. Globes and orbits are intact and unremarkable. No acute soft tissue or calvarial abnormality. Normal aeration of the mastoid air cells and paranasal sinuses. Trace atherosclerotic calcification in the bilateral cavernous carotid arteries.  IMPRESSION: 1. No acute intracranial  abnormality. 2. Remote lacunar infarct in the left basal ganglia 3. Atherosclerotic calcifications in the bilateral cavernous carotid arteries.   Hazeline Junker, MD 10/03/2013, 12:00 AM PGY-1, [facility] FPTS Intern pager: [phone], text pages welcome  I evaluated the patient and agree with the history and physical above.   Si Raider Clinton Sawyer, MD, MBA 10/03/2013, 1:31 AM Family Medicine Resident, PGY-3

## 2013-10-03 NOTE — Discharge Summary (Signed)
Family Medicine Teaching [facility] Discharge Summary  Patient name: Bradley Hall Medical record number: [id number] Date of birth: 02-Jun-1966 Age: 47 y.o. Gender: male Date of Admission: 10/02/2013  Date of Discharge: 10/03/2013 Admitting Physician: Janit Pagan, MD  Primary Care Provider: No primary provider on file. Consultants: Neurology  Indication for Hospitalization: Expressive aphasia concerning for CVA  Discharge Diagnoses/Problem List:  CVA (with h/o prior CVA in 2012) T2DM Hyperlipidemia HTN Tobacco use Alcohol abuse Medical noncompliance  Disposition: Discharged home with home health services  Discharge Condition: Stable  Discharge Exam:  Gen: 47yo male in NAD CV: RRR, no murmur Pulm: CTAB, non-labored Neuro: Still with significant aphasia  Brief Hospital Course:  Bradley Hall is a 47 y.o. male presenting with expressive aphasia and prsumed CVA. PMH is significant for prior CVA in 2012, DM, HTN, HLP, alcohol abuse, and tobacco use. Code stroke was called and neurology deemed his presentation to be outside the window for tPA administration. CT head was negative for hemorrhage. MRI the following morning was limited by pt removing himself before the completion of the scan, but it showed acute subcentimeter infarct affecting the periventricular deep white matter on the left just above and medial to the posterior limb internal capsule. Lower level gyriform like restricted diffusion left occipital lobe could represent a second area of subacute infarction. Carotid dopplers showed no significant stenosis. The family medicine service, in conjunction with Dr. Pearlean Brownie (Neurology) felt that the patient's condition was stable and workup complete, so he was discharged home. Physical therapy recommendations included inpatient rehabilitation which the patient could not afford. Home health was organized by case management prior to discharge. Echocardiogram showed preserved ejection  fraction and grade I diastolic dysfunction. Pt will need TEE as outpatient and to follow up with Dr. Pearlean Brownie in the next few weeks. Patient advised to start increased dose of statin (lipitor 80mg ), ASA, and to take BP medications.   Issues for Follow Up:  Follow up neuro status as pt continued to have expressive aphasia at discharge.  Follow up medical adherence with aspirin, lipitor 80, and BP medication.  Pt to follow up with Neurology, Dr. Pearlean Brownie.   Significant Procedures: None  Significant Labs and Imaging:   Recent Labs Lab 10/02/13 0040 10/02/13 1830 10/02/13 1848  WBC 11.2* 10.6*  --   HGB 14.6 14.7 15.6  HCT 39.7 40.0 46.0  PLT 246 230  --     Recent Labs Lab 10/02/13 0040  10/02/13 1830 10/02/13 1848 10/03/13 0600  NA  --   --  132* 135 133*  K  --   < > 2.8* 2.4* 3.8  CL  --   --  88* 90* 90*  CO2  --   --  30  --  30  GLUCOSE  --   --  228* 235* 191*  BUN  --   --  11 10 6   CREATININE 0.67  --  0.83 1.20 0.76  CALCIUM  --   --  9.9  --  9.8  ALKPHOS  --   --  49  --   --   AST  --   --  24  --   --   ALT  --   --  23  --   --   ALBUMIN  --   --  4.2  --   --   < > = values in this interval not displayed. Lipid Panel     Component Value Date/Time   CHOL  265* 10/03/2013 0040   TRIG 115 10/03/2013 0040   HDL 57 10/03/2013 0040   CHOLHDL 4.6 10/03/2013 0040   VLDL 23 10/03/2013 0040   LDLCALC 185* 10/03/2013 0040   HIV negative TSH: 0.477 INR: 1.00  UDS: negative  EtOH: <11  U/A: negative  Troponin negative x2  ECG: NSR, normal rate, no ST changes   2D ECHO - Left ventricle: The cavity size was normal. Wall thickness was normal. Systolic function was normal. The estimated ejection fraction was in the range of 55% to 60%. Wall motion was normal; there were no regional wall motion abnormalities. Doppler parameters are consistent with abnormal left ventricular relaxation (grade 1 diastolic dysfunction). - Aortic valve: There was no stenosis. -  Mitral valve: No significant regurgitation. - Right ventricle: The cavity size was normal. Systolic function was normal. - Pulmonary arteries: No complete TR doppler jet so unable to estimate PA systolic pressure . - Inferior vena cava: The vessel was normal in size; the respirophasic diameter changes were in the normal range (= 50%); findings are consistent with normal central venous pressure. Impressions: - Normal LV size with EF 55-60%. Normal RV size and systolic function. No significant valvular abnormalities.  CT HEAD WITHOUT CONTRAST  FINDINGS: Negative for acute intracranial hemorrhage, acute infarction, mass, mass effect, hydrocephalus or midline shift. Gray-white differentiation is preserved throughout. A well-circumscribed hypoattenuation centered in the posterior left basal ganglia consistent with evolution of a prior infarct. Global cerebral volume loss appears advanced for age. Mild periventricular white matter hypoattenuation most consistent with a sequela of longstanding microvascular ischemic disease. Globes and orbits are intact and unremarkable. No acute soft tissue or calvarial abnormality. Normal aeration of the mastoid air cells and paranasal sinuses. Trace atherosclerotic calcification in the bilateral cavernous carotid arteries.  IMPRESSION: 1. No acute intracranial abnormality. 2. Remote lacunar infarct in the left basal ganglia 3. Atherosclerotic calcifications in the bilateral cavernous carotid arteries.   MRI HEAD WITHOUT CONTRAST  MRA HEAD WITHOUT CONTRAST  TECHNIQUE:  Multiplanar, multiecho pulse sequences of the brain and surrounding  structures were obtained without intravenous contrast. Angiographic  images of the head were obtained using MRA technique without  contrast.  COMPARISON: CT head 10/02/2013.  FINDINGS:  MRI HEAD FINDINGS  The patient could not cooperate for the entire exam. He removed  himself from the scanner after completion of sagittal T1  and axial  diffusion imaging.  Motion degraded examination demonstrates what appears to be an acute  subcentimeter infarct affecting the periventricular deep white  matter on the left (image 17 series 5). An area of lower level  gyriform restricted diffusion is seen in the left occipital cortex,  possible subacute infarction.  Premature atrophy. No midline abnormality. No obvious extra-axial  fluid collection or mass lesion. No obvious acute hemorrhage.  MRA HEAD FINDINGS  The internal carotid arteries are widely patent. The basilar artery  is widely patent with both vertebrals contributing. There is  moderate to severe disease of both proximal anterior cerebral  arteries, worse on the right. There is no right MCA stenosis. The  left MCA shows mild non stenotic irregularity proximally. No visible  PCA stenosis. No cerebellar branch occlusion. No intracranial  aneurysm.  Moderate irregularity distal MCA and PCA branches suggests  intracranial atherosclerotic change.  IMPRESSION:  MRI HEAD IMPRESSION  Suspected acute subcentimeter infarct affecting the periventricular  deep white matter on the left just above and medial to the posterior  limb internal capsule. Lower level gyriform like  restricted  diffusion left occipital lobe could represent a 2nd area of subacute  infarction.  MRA HEAD IMPRESSION  Moderate to severe disease of both proximal anterior cerebral  arteries, worse on the right. No proximal ICA or MCA stenosis.  Distal MCA and PCA disease suggesting intracranial atherosclerotic  change.   Results/Tests Pending at Time of Discharge: None  Discharge Medications:    Medication List         aspirin EC 81 MG tablet  Take 1 tablet (81 mg total) by mouth daily.     atorvastatin 80 MG tablet  Commonly known as:  LIPITOR  Take 1 tablet (80 mg total) by mouth daily.     hydrochlorothiazide 25 MG tablet  Commonly known as:  HYDRODIURIL  Take 1 tablet (25 mg total) by  mouth daily.     metFORMIN 850 MG tablet  Commonly known as:  GLUCOPHAGE  Take 1 tablet (850 mg total) by mouth 2 (two) times daily with a meal.        Discharge Instructions: Please refer to Patient Instructions section of EMR for full details.  Patient was counseled important signs and symptoms that should prompt return to medical care, changes in medications, dietary instructions, activity restrictions, and follow up appointments.   Follow-Up Appointments: To call Dr. Marlis Edelson office and PCP's office for appointments  Hazeline Junker, MD 10/04/2013, 6:32 AM PGY-1, [facility]

## 2013-10-03 NOTE — Progress Notes (Signed)
VASCULAR LAB PRELIMINARY  PRELIMINARY  PRELIMINARY  PRELIMINARY  Carotid duplex completed.    Preliminary report:  Bilateral:  1-39% ICA stenosis.  Vertebral artery flow is antegrade.     Lochlyn Zullo, RVS 10/03/2013, 9:45 AM

## 2013-10-03 NOTE — Progress Notes (Signed)
Rehab Admissions Coordinator Note:  Patient was screened by Clois Dupes for appropriateness for an Inpatient Acute Rehab Consult.  At this time, we are recommending [facility] with 24/7 supervision and OP therapy or SNF if that level of care can not be provided. Patient at supervision level  200 feet without assist device which BCBS will not approve an inpt rehab stay at this level. Please call me with any questions.   Clois Dupes 10/03/2013, 3:32 PM  I can be reached at [phone].

## 2013-10-03 NOTE — H&P (Signed)
FMTS Attending Admission Note: Janit Pagan, MD I  have seen and examined this patient, reviewed their chart. I have discussed this patient with the resident. I agree with the resident's findings, assessment and care plan.  Briefly 47 y/o M with PMX CVA,HTN,DM,HLD,present with hx of headache and difficulty expressing himself. Hx was limiting this morning but he stated his headache improved,no hx of fever.  Filed Vitals:   10/03/13 0214 10/03/13 0441 10/03/13 0700 10/03/13 0701  BP: 186/97 172/97  173/94  Pulse: 109 100  102  Temp: 99.3 F (37.4 C) 99.4 F (37.4 C) 99.2 F (37.3 C) 99.2 F (37.3 C)  TempSrc: Oral Oral  Oral  Resp: 20 20  20   Height:      Weight:      SpO2: 96% 85%  98%   Exam: Gen: Not in distress,call in bed. Neuro: Awake and alerted,could not assess his orientation but seem to be aware of where he is.Not confused or delirious. Power in both his LL about 5/5 left UL 5/5, right UL 4/5. No sensory loss, gait not assessed. Reflexes ++ globally. Resp: Air entry equal B/L,no wheezing or crepitation. Heart: S1 S2 normal, RRR,no murmur. Abd: Benign. Ext: No edema.  A/P: 1. Expressive dysphasia: Acute    CT head reviewed with no acute finding.    I agree with MRI/MRA brain. ECHO and risk stratification labs.    ASA,Statins.    PT/OT and speech therapy.    Neuro checks.    HIV/Vit B12,TSH also recommended.

## 2013-10-03 NOTE — Progress Notes (Signed)
Family Medicine Progress Note Pt evaluated and chart reviewed.   Pts MR showing. Suspected acute subcentimeter infarct affecting the periventricular deep white matter on the left just above and medial to the posterior limb internal capsule. Lower level gyriform like restricted diffusion left occipital lobe could represent a 2nd area of subacute infarction.  Continues to have significant expressive aphagia  Discussed case w/ Dr. Pearlean Brownie (Neurology), who feels that pts condition is stable and workup complete to this point.   Pt will need TEE as outpt and f/u in his office in the next few weeks CIR denies taking pt and recommended Home Health. Case Manager, Steward Drone, spoke with wife and set up home health prior to discharge. Pts wife contacted (states pt needs to stop drinking and smoking) and made aware of plan. Pt eager to go home.  DC orders in. Pt to start increased dose of statin, ASA, take BP meds.   Shelly Flatten, MD Family Medicine PGY-3 10/03/2013, 4:32 PM

## 2013-10-03 NOTE — ED Provider Notes (Signed)
Medical screening examination/treatment/procedure(s) were performed by non-physician practitioner and as supervising physician I was immediately available for consultation/collaboration.   Junius Argyle, MD 10/03/13 1228

## 2013-10-03 NOTE — Progress Notes (Signed)
Nutrition Brief Note  Patient identified due to Body mass index is 17.88 kg/(m^2). -- Underweight.    RD spoke with patient's wife via telephone who reports patient's weight has been stable since his previous CVA; was eating well PTA.  Wt Readings from Last 15 Encounters:  10/02/13 114 lb 3.2 oz (51.801 kg)  11/02/11 115 lb (52.164 kg)    Current diet order is Carbohydrate Modified Medium Calorie.  Labs and medications reviewed.   No nutrition interventions warranted at this time. If nutrition issues arise, please consult RD.   Maureen Chatters, RD, LDN Pager #: [phone] After-Hours Pager #: [phone]

## 2013-10-03 NOTE — Evaluation (Signed)
Physical Therapy Evaluation Patient Details Name: Bradley Hall MRN: [id number] DOB: 12/03/1966 Today's Date: 10/03/2013 Time: 1415-1440 PT Time Calculation (min): 25 min  PT Assessment / Plan / Recommendation History of Present Illness  Patient is a 47 y/o male admitted with headache and language impairment with PMH positive for HTN, DM type 2, hyperlipidemia, heavy smoker, stroke 2012.  MRI showed acute subcentimeter infarct affecting the periventricular deep white matter on the left (image 17 series 5). An area of lower level gyriform restricted diffusion is seen in the left occipital cortex, possible subacute infarction  Clinical Impression  Patient presents with significant language and likely cognitive deficits impairing function and safety to be home alone at this time.  Mobility deficits include balance and difficulty with obstacle negotiation due to visual deficits.  Feel could be managed at home if 24 hour assist is available (in which case would recommend outpatient therapies.)  May need CIR level rehab if family unable to provide 24 hour assist.    PT Assessment  Patient needs continued PT services    Follow Up Recommendations  Supervision/Assistance - 24 hour;CIR    Does the patient have the potential to tolerate intense rehabilitation    Yes  Barriers to Discharge Decreased caregiver support wife works 12 hour shifts on weekends    Equipment Recommendations  None recommended by PT    Recommendations for Other Services   Rehab consult  Frequency Min 4X/week    Precautions / Restrictions Precautions Precautions: Fall   Pertinent Vitals/Pain C/o headache over left eye      Mobility  Bed Mobility Bed Mobility: Supine to Sit;Sit to Supine Supine to Sit: 6: Modified independent (Device/Increase time) Sit to Supine: 6: Modified independent (Device/Increase time) Transfers Transfers: Sit to Stand;Stand to Sit Sit to Stand: 5: Supervision;From bed Stand to Sit: To  bed;6: Modified independent (Device/Increase time) Details for Transfer Assistance: initially seemed off balance, but braced with legs against bed to avoid balance loss Ambulation/Gait Ambulation/Gait Assistance: 5: Supervision Ambulation Distance (Feet): 200 Feet Assistive device: None Ambulation/Gait Assistance Details: veers to left looking left, to right looking down.  Ran into door frame turning out of stairwell on the right; suspect right field cut; attempting to step over a glove box, pt stepped on the box. Gait Pattern: Step-through pattern;Wide base of support Stairs: Yes Stairs Assistance: 4: Min guard;5: Supervision Stairs Assistance Details (indicate cue type and reason): veered away from railing during descent without loss of balance, but then reached for wall halfway down. Stair Management Technique: One rail Left;Alternating pattern;Forwards Number of Stairs: 10 Modified Rankin (Stroke Patients Only) Pre-Morbid Rankin Score: No symptoms (assumed) Modified Rankin: Moderate disability        PT Diagnosis: Abnormality of gait  PT Problem List: Decreased balance;Decreased mobility;Decreased safety awareness;Decreased cognition PT Treatment Interventions: Balance training;Neuromuscular re-education;Stair training;Functional mobility training;Patient/family education;Therapeutic activities     PT Goals(Current goals can be found in the care plan section) Acute Rehab PT Goals Patient Stated Goal: I want to go home PT Goal Formulation: Patient unable to participate in goal setting Time For Goal Achievement: 10/10/13 Potential to Achieve Goals: Good  Visit Information  Last PT Received On: 10/03/13 Assistance Needed: +1 History of Present Illness: Patient is a 47 y/o male admitted with headache and language impairment with PMH positive for HTN, DM type 2, hyperlipidemia, heavy smoker, stroke 2012.  MRI showed acute subcentimeter infarct affecting the periventricular deep  white matter on the left (image 17 series 5). An area  of lower level gyriform restricted diffusion is seen in the left occipital cortex, possible subacute infarction       Prior Functioning  Home Living Family/patient expects to be discharged to:: Private residence Living Arrangements: Spouse/significant other Available Help at Discharge: Family;Available PRN/intermittently Additional Comments: wife works 12 hour shifts on weekends; difficulty getting home situation due to aphasia Communication Communication: Expressive difficulties;Receptive difficulties    Cognition  Cognition Arousal/Alertness: Awake/alert Behavior During Therapy: Anxious Overall Cognitive Status: Difficult to assess Difficult to assess due to: Impaired communication    Extremity/Trunk Assessment Upper Extremity Assessment Upper Extremity Assessment: RUE deficits/detail RUE Deficits / Details: decreased coordination with finger to nose and each finger to thumb as compared to left, was unable to write his name Lower Extremity Assessment Lower Extremity Assessment: Overall WFL for tasks assessed   Balance Balance Balance Assessed: Yes Dynamic Sitting Balance Dynamic Sitting - Balance Support: Feet supported;During functional activity Dynamic Sitting - Level of Assistance: 6: Modified independent (Device/Increase time) Dynamic Sitting - Comments: bending to pick up item from floor Static Standing Balance Static Standing - Balance Support: No upper extremity supported Static Standing - Level of Assistance: 5: Stand by assistance;6: Modified independent (Device/Increase time) Static Standing - Comment/# of Minutes: initially braced with legs against bed, then able to stand unaided without balance loss  End of Session PT - End of Session Equipment Utilized During Treatment: Gait belt Activity Tolerance: Patient tolerated treatment well Patient left: in bed;with bed alarm set;with call bell/phone within reach   GP Functional Assessment Tool Used: Clinical Judgement Functional Limitation: Mobility: Walking and moving around Mobility: Walking and Moving Around Current Status ([id number]): At least 20 percent but less than 40 percent impaired, limited or restricted Mobility: Walking and Moving Around Goal Status [phone]): At least 20 percent but less than 40 percent impaired, limited or restricted   [facility] 10/03/2013, 2:57 PM  [place], [place] [phone] 10/03/2013

## 2013-10-03 NOTE — Progress Notes (Signed)
  Echocardiogram 2D Echocardiogram has been performed.  Bradley Hall 10/03/2013, 9:21 AM

## 2013-10-03 NOTE — Progress Notes (Signed)
Talked to patient's spouse about home health care choices; home care options given, spouse chose [facility]. Message left with [facility] with [facility] for HHPT/ ST - discharge is planned for today; B Zamyah Wiesman RN,BSN,MHA

## 2013-10-03 NOTE — Evaluation (Signed)
Occupational Therapy Evaluation Patient Details Name: Bradley Hall MRN: [id number] DOB: 05/16/1966 Today's Date: 10/03/2013 Time: 7829-5621 OT Time Calculation (min): 21 min  OT Assessment / Plan / Recommendation History of present illness Patient is a 47 y/o male admitted with headache and language impairment with PMH positive for HTN, DM type 2, hyperlipidemia, heavy smoker, stroke 2012.  MRI showed acute subcentimeter infarct affecting the periventricular deep white matter on the left (image 17 series 5). An area of lower level gyriform restricted diffusion is seen in the left occipital cortex, possible subacute infarction   Clinical Impression   Pt admitted with above. Presents with significant language deficits and possible visual deficits. Pt requires supervision and cues for basic ADL tasks and functional mobility.  Pt will need 24/7 supervision at home for safety.  Will recommend HH and 24/7 supervision (declined by CIR). Will continue to follow acutely in order to address below problem list.    OT Assessment  Patient needs continued OT Services    Follow Up Recommendations  Home health OT;Supervision/Assistance - 24 hour    Barriers to Discharge Decreased caregiver support wife works during day  Equipment Recommendations  None recommended by OT    Recommendations for Other Services    Frequency  Min 3X/week    Precautions / Restrictions Precautions Precautions: Fall   Pertinent Vitals/Pain See vitals    ADL  Grooming: Performed;Wash/dry hands;Wash/dry face;Minimal assistance Where Assessed - Grooming: Unsupported standing Upper Body Bathing: Simulated;Supervision/safety Where Assessed - Upper Body Bathing: Unsupported sitting Lower Body Bathing: Simulated;Supervision/safety Where Assessed - Lower Body Bathing: Unsupported sit to stand Upper Body Dressing: Simulated;Supervision/safety Where Assessed - Upper Body Dressing: Unsupported sitting Lower Body Dressing:  Simulated;Supervision/safety Where Assessed - Lower Body Dressing: Unsupported sit to stand Toilet Transfer: Simulated;Supervision/safety Toilet Transfer Method: Sit to Barista:  (bed) Equipment Used: Gait belt Transfers/Ambulation Related to ADLs: supervision ADL Comments: Pt seems slow to process questions/commands but difficult to assess due to aphasia.  Assist for grooming to locate items- verbally and visually cued pt to locate grooming items.  Attempted to assess vision but pt painful and having difficult time participating in formal vision assessment (possibly due to aphasia?).  When asked to write and draw picture, pt picked up pen in right hand but then put pen back down stating "I can't".    OT Diagnosis: Acute pain;Generalized weakness;Cognitive deficits;Disturbance of vision  OT Problem List: Decreased strength;Decreased activity tolerance;Impaired vision/perception;Pain;Decreased cognition OT Treatment Interventions: Self-care/ADL training;DME and/or AE instruction;Therapeutic activities;Balance training;Visual/perceptual remediation/compensation;Patient/family education   OT Goals(Current goals can be found in the care plan section) Acute Rehab OT Goals Patient Stated Goal: I want to go home OT Goal Formulation: With patient Time For Goal Achievement: 10/10/13 Potential to Achieve Goals: Good  Visit Information  Last OT Received On: 10/03/13 Assistance Needed: +1 History of Present Illness: Patient is a 47 y/o male admitted with headache and language impairment with PMH positive for HTN, DM type 2, hyperlipidemia, heavy smoker, stroke 2012.  MRI showed acute subcentimeter infarct affecting the periventricular deep white matter on the left (image 17 series 5). An area of lower level gyriform restricted diffusion is seen in the left occipital cortex, possible subacute infarction       Prior Functioning     Home Living Family/patient expects to be  discharged to:: Private residence Living Arrangements: Spouse/significant other Available Help at Discharge: Family;Available PRN/intermittently Additional Comments: wife works 12 hour shifts on weekends; difficulty getting home situation due to  aphasia Communication Communication: Expressive difficulties;Receptive difficulties         Vision/Perception Vision - Assessment Additional Comments: Difficult to assess vision due to left eye pain and pt attempting to lie back down. Noted that pt required cues to locate items on right side.   Cognition  Cognition Arousal/Alertness: Awake/alert Behavior During Therapy: Flat affect Overall Cognitive Status: Difficult to assess Difficult to assess due to: Impaired communication    Extremity/Trunk Assessment Upper Extremity Assessment Upper Extremity Assessment: RUE deficits/detail RUE Deficits / Details: 4/5  RUE Coordination: decreased fine motor Lower Extremity Assessment Lower Extremity Assessment: Overall WFL for tasks assessed     Mobility Bed Mobility Bed Mobility: Supine to Sit;Sitting - Scoot to Edge of Bed;Sit to Supine Supine to Sit: 6: Modified independent (Device/Increase time) Sitting - Scoot to Edge of Bed: 6: Modified independent (Device/Increase time) Sit to Supine: 6: Modified independent (Device/Increase time) Transfers Transfers: Sit to Stand;Stand to Sit Sit to Stand: 5: Supervision;From bed Stand to Sit: 5: Supervision;To bed Details for Transfer Assistance: initially seemed off balance, but braced with legs against bed to avoid balance loss     Exercise     Balance   End of Session OT - End of Session Equipment Utilized During Treatment: Gait belt Activity Tolerance: Patient limited by fatigue;Patient limited by pain Patient left: in bed;with call bell/phone within reach;with bed alarm set  GO   10/03/2013 Cipriano Mile OTR/L Pager [phone] Office [phone]   Cipriano Mile 10/03/2013, 5:19 PM

## 2013-10-03 NOTE — Progress Notes (Signed)
   CARE MANAGEMENT NOTE 10/03/2013  Patient:  Bradley Hall,Bradley Hall   Account Number:  [id number]  Date Initiated:  10/03/2013  Documentation initiated by:  Jiles Crocker  Subjective/Objective Assessment:   ADMITTED FOR CVA     Action/Plan:   LIVES AT HOME WITH SPOUSE; CM FOLLOWING FOR DCP   Anticipated DC Date:  10/08/2013   Anticipated DC Plan:  HOME/SELF CARE POSSIBLY;       DC Planning Services  CM consult          Status of service:  Completed, signed off Medicare Important Message given?  NA - LOS <3 / Initial given by admissions (If response is "NO", the following Medicare IM given date fields will be blank)  Per UR Regulation:  Reviewed for med. necessity/level of care/duration of stay  Comments:  10/10/2014Abelino Derrick RN,BSN,MHA [phone]

## 2013-10-04 NOTE — Discharge Summary (Signed)
I agree with the discharge summary as documented.   Chinedu Agustin MD  

## 2013-10-06 ENCOUNTER — Telehealth: Payer: Self-pay | Admitting: Neurology

## 2013-10-09 NOTE — Telephone Encounter (Signed)
Called patient to schedule 2 month stroke f/u w/ NP-Lam. Left vmail.

## 2014-06-03 ENCOUNTER — Other Ambulatory Visit: Payer: Self-pay | Admitting: Internal Medicine

## 2014-06-03 DIAGNOSIS — R945 Abnormal results of liver function studies: Secondary | ICD-10-CM

## 2014-06-03 DIAGNOSIS — R7989 Other specified abnormal findings of blood chemistry: Secondary | ICD-10-CM

## 2014-06-08 ENCOUNTER — Other Ambulatory Visit: Payer: Self-pay | Admitting: Internal Medicine

## 2014-06-08 DIAGNOSIS — R51 Headache: Secondary | ICD-10-CM

## 2014-06-09 ENCOUNTER — Other Ambulatory Visit: Payer: BC Managed Care – PPO

## 2014-06-11 ENCOUNTER — Ambulatory Visit
Admission: RE | Admit: 2014-06-11 | Discharge: 2014-06-11 | Disposition: A | Discharge status: Home / Self Care | Payer: BC Managed Care – PPO | Source: Ambulatory Visit | Attending: Internal Medicine | Admitting: Internal Medicine

## 2014-06-11 DIAGNOSIS — R945 Abnormal results of liver function studies: Secondary | ICD-10-CM

## 2014-06-11 DIAGNOSIS — R7989 Other specified abnormal findings of blood chemistry: Secondary | ICD-10-CM

## 2016-03-02 DIAGNOSIS — L0231 Cutaneous abscess of buttock: Secondary | ICD-10-CM | POA: Diagnosis not present

## 2016-03-02 DIAGNOSIS — I69351 Hemiplegia and hemiparesis following cerebral infarction affecting right dominant side: Secondary | ICD-10-CM | POA: Diagnosis not present

## 2016-03-02 DIAGNOSIS — I10 Essential (primary) hypertension: Secondary | ICD-10-CM | POA: Diagnosis not present

## 2016-03-02 DIAGNOSIS — I69320 Aphasia following cerebral infarction: Secondary | ICD-10-CM | POA: Diagnosis not present

## 2018-05-11 ENCOUNTER — Emergency Department (HOSPITAL_COMMUNITY): Payer: BLUE CROSS/BLUE SHIELD

## 2018-05-11 ENCOUNTER — Inpatient Hospital Stay (HOSPITAL_COMMUNITY)
Admission: EM | Admit: 2018-05-11 | Discharge: 2018-05-13 | DRG: 065 | Disposition: A | Discharge status: Home Health | Payer: BLUE CROSS/BLUE SHIELD | Attending: Family Medicine | Admitting: Family Medicine

## 2018-05-11 ENCOUNTER — Encounter (HOSPITAL_COMMUNITY): Payer: Self-pay | Admitting: Emergency Medicine

## 2018-05-11 DIAGNOSIS — I69320 Aphasia following cerebral infarction: Secondary | ICD-10-CM

## 2018-05-11 DIAGNOSIS — Z7982 Long term (current) use of aspirin: Secondary | ICD-10-CM

## 2018-05-11 DIAGNOSIS — E785 Hyperlipidemia, unspecified: Secondary | ICD-10-CM | POA: Diagnosis present

## 2018-05-11 DIAGNOSIS — E871 Hypo-osmolality and hyponatremia: Secondary | ICD-10-CM | POA: Diagnosis present

## 2018-05-11 DIAGNOSIS — I63531 Cerebral infarction due to unspecified occlusion or stenosis of right posterior cerebral artery: Principal | ICD-10-CM | POA: Diagnosis present

## 2018-05-11 DIAGNOSIS — H5410 Blindness, one eye, low vision other eye, unspecified eyes: Secondary | ICD-10-CM

## 2018-05-11 DIAGNOSIS — E1151 Type 2 diabetes mellitus with diabetic peripheral angiopathy without gangrene: Secondary | ICD-10-CM | POA: Diagnosis present

## 2018-05-11 DIAGNOSIS — I110 Hypertensive heart disease with heart failure: Secondary | ICD-10-CM | POA: Diagnosis present

## 2018-05-11 DIAGNOSIS — H5462 Unqualified visual loss, left eye, normal vision right eye: Secondary | ICD-10-CM | POA: Diagnosis present

## 2018-05-11 DIAGNOSIS — F17210 Nicotine dependence, cigarettes, uncomplicated: Secondary | ICD-10-CM | POA: Diagnosis present

## 2018-05-11 DIAGNOSIS — Z7984 Long term (current) use of oral hypoglycemic drugs: Secondary | ICD-10-CM

## 2018-05-11 DIAGNOSIS — I639 Cerebral infarction, unspecified: Secondary | ICD-10-CM

## 2018-05-11 DIAGNOSIS — Z716 Tobacco abuse counseling: Secondary | ICD-10-CM

## 2018-05-11 DIAGNOSIS — Z79899 Other long term (current) drug therapy: Secondary | ICD-10-CM

## 2018-05-11 DIAGNOSIS — E119 Type 2 diabetes mellitus without complications: Secondary | ICD-10-CM

## 2018-05-11 DIAGNOSIS — I509 Heart failure, unspecified: Secondary | ICD-10-CM | POA: Diagnosis present

## 2018-05-11 DIAGNOSIS — Z91199 Patient's noncompliance with other medical treatment and regimen due to unspecified reason: Secondary | ICD-10-CM

## 2018-05-11 DIAGNOSIS — I10 Essential (primary) hypertension: Secondary | ICD-10-CM | POA: Diagnosis present

## 2018-05-11 DIAGNOSIS — R4182 Altered mental status, unspecified: Secondary | ICD-10-CM | POA: Diagnosis present

## 2018-05-11 DIAGNOSIS — F1010 Alcohol abuse, uncomplicated: Secondary | ICD-10-CM | POA: Diagnosis present

## 2018-05-11 DIAGNOSIS — Z9119 Patient's noncompliance with other medical treatment and regimen: Secondary | ICD-10-CM

## 2018-05-11 DIAGNOSIS — I69351 Hemiplegia and hemiparesis following cerebral infarction affecting right dominant side: Secondary | ICD-10-CM

## 2018-05-11 DIAGNOSIS — E1165 Type 2 diabetes mellitus with hyperglycemia: Secondary | ICD-10-CM | POA: Diagnosis present

## 2018-05-11 DIAGNOSIS — Z9114 Patient's other noncompliance with medication regimen: Secondary | ICD-10-CM

## 2018-05-11 HISTORY — DX: Cerebral infarction, unspecified: I63.9

## 2018-05-11 LAB — COMPREHENSIVE METABOLIC PANEL
ALK PHOS: 66 U/L (ref 38–126)
ALT: 18 U/L (ref 17–63)
AST: 22 U/L (ref 15–41)
Albumin: 4.2 g/dL (ref 3.5–5.0)
Anion gap: 11 (ref 5–15)
BILIRUBIN TOTAL: 0.6 mg/dL (ref 0.3–1.2)
BUN: 14 mg/dL (ref 6–20)
CALCIUM: 9.4 mg/dL (ref 8.9–10.3)
CO2: 19 mmol/L — ABNORMAL LOW (ref 22–32)
CREATININE: 1.3 mg/dL — AB (ref 0.61–1.24)
Chloride: 98 mmol/L — ABNORMAL LOW (ref 101–111)
GFR calc Af Amer: >60 mL/min (ref >60)
GFR calc non Af Amer: >60 mL/min (ref >60)
Glucose, Bld: 72 mg/dL (ref 65–99)
Potassium: 3.9 mmol/L (ref 3.5–5.1)
Sodium: 128 mmol/L — ABNORMAL LOW (ref 135–145)
Total Protein: 7.1 g/dL (ref 6.5–8.1)

## 2018-05-11 LAB — URINALYSIS, ROUTINE W REFLEX MICROSCOPIC
Bilirubin Urine: NEGATIVE
Glucose, UA: NEGATIVE mg/dL
Hgb urine dipstick: NEGATIVE
Ketones, ur: 5 mg/dL — AB
Nitrite: NEGATIVE
PROTEIN: NEGATIVE mg/dL
Specific Gravity, Urine: 1.011 (ref 1.005–1.030)
pH: 5 (ref 5.0–8.0)

## 2018-05-11 LAB — DIFFERENTIAL
Abs Immature Granulocytes: 0 10*3/uL (ref 0.0–0.1)
Basophils Absolute: 0 10*3/uL (ref 0.0–0.1)
Basophils Relative: 0 %
Eosinophils Absolute: 0.1 10*3/uL (ref 0.0–0.7)
Eosinophils Relative: 1 %
Immature Granulocytes: 0 %
LYMPHS ABS: 2.1 10*3/uL (ref 0.7–4.0)
Lymphocytes Relative: 24 %
MONO ABS: 0.8 10*3/uL (ref 0.1–1.0)
Monocytes Relative: 8 %
NEUTROS ABS: 6 10*3/uL (ref 1.7–7.7)
NEUTROS PCT: 67 %

## 2018-05-11 LAB — I-STAT CHEM 8, ED
BUN: 17 mg/dL (ref 6–20)
CREATININE: 1.1 mg/dL (ref 0.61–1.24)
Calcium, Ion: 1.16 mmol/L (ref 1.15–1.40)
Chloride: 98 mmol/L — ABNORMAL LOW (ref 101–111)
Glucose, Bld: 69 mg/dL (ref 65–99)
HEMATOCRIT: 43 % (ref 39.0–52.0)
HEMOGLOBIN: 14.6 g/dL (ref 13.0–17.0)
Potassium: 4 mmol/L (ref 3.5–5.1)
Sodium: 132 mmol/L — ABNORMAL LOW (ref 135–145)
TCO2: 22 mmol/L (ref 22–32)

## 2018-05-11 LAB — I-STAT TROPONIN, ED: TROPONIN I, POC: 0 ng/mL (ref 0.00–0.08)

## 2018-05-11 LAB — RAPID URINE DRUG SCREEN, HOSP PERFORMED
AMPHETAMINES: NOT DETECTED
Barbiturates: NOT DETECTED
Benzodiazepines: NOT DETECTED
Cocaine: NOT DETECTED
Opiates: NOT DETECTED
Tetrahydrocannabinol: NOT DETECTED

## 2018-05-11 LAB — CBC
HEMATOCRIT: 40.8 % (ref 39.0–52.0)
HEMOGLOBIN: 13.9 g/dL (ref 13.0–17.0)
MCH: 32 pg (ref 26.0–34.0)
MCHC: 34.1 g/dL (ref 30.0–36.0)
MCV: 93.8 fL (ref 78.0–100.0)
Platelets: 326 10*3/uL (ref 150–400)
RBC: 4.35 MIL/uL (ref 4.22–5.81)
RDW: 11.9 % (ref 11.5–15.5)
WBC: 9 10*3/uL (ref 4.0–10.5)

## 2018-05-11 LAB — ETHANOL: Alcohol, Ethyl (B): <10 mg/dL (ref <10)

## 2018-05-11 LAB — PROTIME-INR
INR: 0.96
Prothrombin Time: 12.7 seconds (ref 11.4–15.2)

## 2018-05-11 LAB — APTT: aPTT: 37 seconds — ABNORMAL HIGH (ref 24–36)

## 2018-05-11 MED ORDER — IOPAMIDOL (ISOVUE-370) INJECTION 76%
INTRAVENOUS | Status: AC
  Filled 2018-05-11: qty 100

## 2018-05-11 MED ORDER — SODIUM CHLORIDE 0.9 % IV SOLN: INTRAVENOUS | Status: DC

## 2018-05-11 NOTE — ED Provider Notes (Signed)
[facility] EMERGENCY DEPARTMENT Provider Note   CSN: [id number] Arrival date & time: 05/11/18  1547     History   Chief Complaint Chief Complaint  Patient presents with  . Aphasia  . Altered Mental Status    HPI Bradley Hall is a 52 y.o. male.  Patient brought in by EMS.  Supposedly patient last seen normal 11:30 PM last night.  Son noticed confusion and aphasia when patient awoke this morning.  Son also stated that he had right leg deficit from previous stroke.  Patient denied any particular symptoms.  Patient seemed to having some speech problems that was intermittent.  Also some questionable visual abnormalities.  But seem to have no extremity neuro deficits at all.     Past Medical History:  Diagnosis Date  . Diabetes mellitus   . Hyperlipidemia   . Hypertension   . Stroke [facility])     Patient Active Problem List   Diagnosis Date Noted  . CVA (cerebral infarction) 11/02/2011  . HTN (hypertension) 11/02/2011  . HLD (hyperlipidemia) 11/02/2011  . Diabetes mellitus 11/02/2011  . Alcohol abuse 11/02/2011    Past Surgical History:  Procedure Laterality Date  . NO PAST SURGERIES          Home Medications    Prior to Admission medications   Medication Sig Start Date End Date Taking? Authorizing Provider  aspirin EC 81 MG tablet Take 1 tablet (81 mg total) by mouth daily. 10/03/13   Ozella Rocks, MD  atorvastatin (LIPITOR) 80 MG tablet Take 1 tablet (80 mg total) by mouth daily. 10/03/13   Ozella Rocks, MD  hydrochlorothiazide (HYDRODIURIL) 25 MG tablet Take 1 tablet (25 mg total) by mouth daily. 11/03/11 11/02/12  Dorothea Ogle, MD  metFORMIN (GLUCOPHAGE) 850 MG tablet Take 1 tablet (850 mg total) by mouth 2 (two) times daily with a meal. 11/03/11 11/02/12  Dorothea Ogle, MD    Family History No family history on file.  Social History Social History   Tobacco Use  . Smoking status: Current Every Day Smoker    Packs/day: 0.50    Substance Use Topics  . Alcohol use: Yes    Alcohol/week: 1.5 oz    Types: 3 drink(s) per week  . Drug use: No     Allergies   Patient has no known allergies.   Review of Systems Review of Systems  Constitutional: Negative for fever.  HENT: Negative for congestion.   Eyes: Positive for visual disturbance.  Respiratory: Negative for shortness of breath.   Cardiovascular: Negative for chest pain.  Gastrointestinal: Negative for abdominal pain, nausea and vomiting.  Genitourinary: Negative for dysuria.  Musculoskeletal: Negative for myalgias.  Skin: Negative for wound.  Neurological: Positive for speech difficulty.  Hematological: Does not bruise/bleed easily.  Psychiatric/Behavioral: Positive for confusion.     Physical Exam Updated Vital Signs BP (!) 141/83   Pulse 76   Temp 98.5 F (36.9 C)   Resp 18   SpO2 99%   Physical Exam  Constitutional: He appears well-developed and well-nourished. No distress.  HENT:  Head: Normocephalic and atraumatic.  Mouth/Throat: Oropharynx is clear and moist.  Eyes: Pupils are equal, round, and reactive to light. Conjunctivae and EOM are normal.  Neck: Neck supple.  Cardiovascular: Normal rate, regular rhythm and normal heart sounds.  Pulmonary/Chest: Effort normal and breath sounds normal. No respiratory distress.  Abdominal: Soft. Bowel sounds are normal. There is no tenderness.  Musculoskeletal: Normal range of motion.  He exhibits no edema.  Neurological: He is alert. A cranial nerve deficit is present. No sensory deficit. He exhibits normal muscle tone. Coordination normal.  Patient with some intermittent a aphasia.  Skin: Skin is warm.  Nursing note and vitals reviewed.    ED Treatments / Results  Labs (all labs ordered are listed, but only abnormal results are displayed) Labs Reviewed  APTT - Abnormal; Notable for the following components:      Result Value   aPTT 37 (*)    All other components within normal limits   COMPREHENSIVE METABOLIC PANEL - Abnormal; Notable for the following components:   Sodium 128 (*)    Chloride 98 (*)    CO2 19 (*)    Creatinine, Ser 1.30 (*)    All other components within normal limits  URINALYSIS, ROUTINE W REFLEX MICROSCOPIC - Abnormal; Notable for the following components:   Ketones, ur 5 (*)    Leukocytes, UA TRACE (*)    Bacteria, UA RARE (*)    All other components within normal limits  I-STAT CHEM 8, ED - Abnormal; Notable for the following components:   Sodium 132 (*)    Chloride 98 (*)    All other components within normal limits  ETHANOL  PROTIME-INR  CBC  DIFFERENTIAL  RAPID URINE DRUG SCREEN, HOSP PERFORMED  I-STAT TROPONIN, ED    EKG EKG Interpretation  Date/Time:  Saturday May 11 2018 15:51:44 EDT Ventricular Rate:  80 PR Interval:    QRS Duration: 92 QT Interval:  366 QTC Calculation: 423 R Axis:   -60 Text Interpretation:  Sinus rhythm Left anterior fascicular block Consider anterior infarct Confirmed by Vanetta Mulders [phone]) on 05/11/2018 3:58:19 PM   Radiology Dg Chest 2 View  Result Date: 05/11/2018 CLINICAL DATA:  Altered mental status.  History of stroke. EXAM: CHEST - 2 VIEW COMPARISON:  Chest radiograph October 02, 2013 FINDINGS: Cardiomediastinal silhouette is normal. No pleural effusions or focal consolidations. Trachea projects midline and there is no pneumothorax. Soft tissue planes and included osseous structures are non-suspicious. IMPRESSION: Negative. Electronically Signed   By: Awilda Metro M.D.   On: 05/11/2018 17:36   Ct Head Wo Contrast  Result Date: 05/11/2018 CLINICAL DATA:  Altered mental status EXAM: CT HEAD WITHOUT CONTRAST TECHNIQUE: Contiguous axial images were obtained from the base of the skull through the vertex without intravenous contrast. COMPARISON:  MRI 10/03/2013 FINDINGS: Brain: There is atrophy and chronic small vessel disease changes. Multiple old bilateral basal ganglia and thalamic lacunar  infarcts. No acute intracranial abnormality. Specifically, no hemorrhage, hydrocephalus, mass lesion, acute infarction, or significant intracranial injury. Vascular: No hyperdense vessel or unexpected calcification. Skull: No acute calvarial abnormality. Sinuses/Orbits: Visualized paranasal sinuses and mastoids clear. Orbital soft tissues unremarkable. Other: None IMPRESSION: No acute intracranial abnormality. Atrophy, chronic microvascular disease. Old bilateral lacunar infarcts as above. Electronically Signed   By: Charlett Nose M.D.   On: 05/11/2018 17:07   Mr Brain Wo Contrast (neuro Protocol)  Result Date: 05/11/2018 CLINICAL DATA:  Initial evaluation for acute confusion, aphasia. EXAM: MRI HEAD WITHOUT CONTRAST TECHNIQUE: Multiplanar, multiecho pulse sequences of the brain and surrounding structures were obtained without intravenous contrast. COMPARISON:  Prior CT from earlier the same day as well as previous MRI from 10/03/2013 FINDINGS: Brain: Study somewhat limited as the patient was unable to tolerate the full length of the exam, and the studies provided are mildly degraded by motion artifact. Diffuse prominence of the CSF containing spaces compatible with generalized  cerebral atrophy. Patchy and confluent T2/FLAIR hyperintensity within the periventricular and deep white matter both cerebral hemispheres most compatible with chronic small vessel ischemic change, advanced for age. Chronic microvascular ischemic changes present within the pons. Multiple superimposed remote lacunar infarcts present within the bilateral basal ganglia/corona radiata, thalami, pons, and left middle cerebellar peduncle. There is patchy small volume restricted diffusion within the right splenium and parasagittal right occipital lobe (series 5001, image 63, 61, 59), consistent with acute ischemic infarct, right PCA territory. No associated hemorrhage or mass effect. No other evidence for acute or subacute ischemia. Gray-white  matter differentiation otherwise maintained. No acute or chronic intracranial hemorrhage. No mass lesion, midline shift or mass effect. No hydrocephalus. No extra-axial fluid collection. Major dural sinuses are grossly patent. Pituitary gland suprasellar region normal. Midline structures intact and normal. Vascular: Major intracranial vascular flow voids are maintained. Skull and upper cervical spine: Craniocervical junction within normal limits. Visualized upper cervical spine demonstrates no acute finding. Bone marrow signal intensity normal. No scalp soft tissue abnormality. Sinuses/Orbits: Globes and orbital soft tissues within normal limits. Mild scattered mucosal thickening within the ethmoidal air cells. Paranasal sinuses are otherwise clear. No mastoid effusion. Inner ear structures normal. Other: None. IMPRESSION: 1. Patchy small volume acute ischemic nonhemorrhagic infarcts involving the right splenium and parasagittal right occipital lobe. 2. Atrophy with advanced chronic small vessel ischemic disease with multiple remote lacunar infarcts involving the bilateral basal ganglia/corona radiata, thalami, and pons. Electronically Signed   By: Rise Mu M.D.   On: 05/11/2018 21:36    Procedures Procedures (including critical care time)  CRITICAL CARE Performed by: Vanetta Mulders Total critical care time: 30 minutes Critical care time was exclusive of separately billable procedures and treating other patients. Critical care was necessary to treat or prevent imminent or life-threatening deterioration. Critical care was time spent personally by me on the following activities: development of treatment plan with patient and/or surrogate as well as nursing, discussions with consultants, evaluation of patient's response to treatment, examination of patient, obtaining history from patient or surrogate, ordering and performing treatments and interventions, ordering and review of laboratory  studies, ordering and review of radiographic studies, pulse oximetry and re-evaluation of patient's condition.   Medications Ordered in ED Medications  0.9 %  sodium chloride infusion (has no administration in time range)     Initial Impression / Assessment and Plan / ED Course  I have reviewed the triage vital signs and the nursing notes.  Pertinent labs & imaging results that were available during my care of the patient were reviewed by me and considered in my medical decision making (see chart for details).     Patient clinically was suspicious for intermittent TIAs or actual stroke.  CT negative patient last normal supposedly at 2330 yesterday.  Patient with intermittent a aphasia and some confusion and questionable visual changes.  Head CT was negative code stroke was not called because of the duration of the symptoms and the fact that things were intermittent.  MRI confirmed ischemic stroke.  Patient is a aphasia had seemed to resolve but earlier it seemed to be coming and going at times.  Patient's wife showed up and said that his speech is been intermittently off since getting up this morning.  I felt clinically that may be patient had some visual changes that were intermittent as well.  Patient had no extremity weakness.  Apparently had previous stroke but extremities with strength was very normal.  When MRI results were back contacted  neuro hospitalist.  He wanted CT angios ordered that has been ordered.  Will be admitted by the hospitalist service.  Patient made aware of the findings.  Final Clinical Impressions(s) / ED Diagnoses   Final diagnoses:  Cerebrovascular accident (CVA), unspecified mechanism [facility])    ED Discharge Orders    None       Vanetta Mulders, MD 05/12/18 [phone]

## 2018-05-11 NOTE — ED Notes (Signed)
Bradley Hall (wife) @ [phone]

## 2018-05-11 NOTE — ED Triage Notes (Signed)
Per [facility] pt coming from home was last seen normal at 1130 last night. Son noticed confusion and aphasia with pt when awaking today. Pt has hx of stroke that left him with right leg deficit.

## 2018-05-11 NOTE — ED Notes (Signed)
Patient transported to X-ray 

## 2018-05-12 ENCOUNTER — Other Ambulatory Visit (HOSPITAL_COMMUNITY): Payer: BLUE CROSS/BLUE SHIELD

## 2018-05-12 ENCOUNTER — Inpatient Hospital Stay (HOSPITAL_COMMUNITY): Payer: BLUE CROSS/BLUE SHIELD

## 2018-05-12 ENCOUNTER — Emergency Department (HOSPITAL_COMMUNITY): Payer: BLUE CROSS/BLUE SHIELD

## 2018-05-12 DIAGNOSIS — Z7984 Long term (current) use of oral hypoglycemic drugs: Secondary | ICD-10-CM | POA: Diagnosis not present

## 2018-05-12 DIAGNOSIS — E1159 Type 2 diabetes mellitus with other circulatory complications: Secondary | ICD-10-CM

## 2018-05-12 DIAGNOSIS — Z9119 Patient's noncompliance with other medical treatment and regimen: Secondary | ICD-10-CM

## 2018-05-12 DIAGNOSIS — Z9114 Patient's other noncompliance with medication regimen: Secondary | ICD-10-CM | POA: Diagnosis not present

## 2018-05-12 DIAGNOSIS — E871 Hypo-osmolality and hyponatremia: Secondary | ICD-10-CM | POA: Diagnosis present

## 2018-05-12 DIAGNOSIS — E1151 Type 2 diabetes mellitus with diabetic peripheral angiopathy without gangrene: Secondary | ICD-10-CM | POA: Diagnosis present

## 2018-05-12 DIAGNOSIS — I5030 Unspecified diastolic (congestive) heart failure: Secondary | ICD-10-CM

## 2018-05-12 DIAGNOSIS — I63531 Cerebral infarction due to unspecified occlusion or stenosis of right posterior cerebral artery: Principal | ICD-10-CM

## 2018-05-12 DIAGNOSIS — I110 Hypertensive heart disease with heart failure: Secondary | ICD-10-CM | POA: Diagnosis present

## 2018-05-12 DIAGNOSIS — R4182 Altered mental status, unspecified: Secondary | ICD-10-CM | POA: Diagnosis present

## 2018-05-12 DIAGNOSIS — H5462 Unqualified visual loss, left eye, normal vision right eye: Secondary | ICD-10-CM | POA: Diagnosis present

## 2018-05-12 DIAGNOSIS — I639 Cerebral infarction, unspecified: Secondary | ICD-10-CM | POA: Diagnosis present

## 2018-05-12 DIAGNOSIS — F17210 Nicotine dependence, cigarettes, uncomplicated: Secondary | ICD-10-CM | POA: Diagnosis present

## 2018-05-12 DIAGNOSIS — I509 Heart failure, unspecified: Secondary | ICD-10-CM | POA: Diagnosis present

## 2018-05-12 DIAGNOSIS — E782 Mixed hyperlipidemia: Secondary | ICD-10-CM

## 2018-05-12 DIAGNOSIS — Z91199 Patient's noncompliance with other medical treatment and regimen due to unspecified reason: Secondary | ICD-10-CM

## 2018-05-12 DIAGNOSIS — Z7982 Long term (current) use of aspirin: Secondary | ICD-10-CM | POA: Diagnosis not present

## 2018-05-12 DIAGNOSIS — F1010 Alcohol abuse, uncomplicated: Secondary | ICD-10-CM | POA: Diagnosis present

## 2018-05-12 DIAGNOSIS — H5410 Blindness, one eye, low vision other eye, unspecified eyes: Secondary | ICD-10-CM

## 2018-05-12 DIAGNOSIS — Z716 Tobacco abuse counseling: Secondary | ICD-10-CM | POA: Diagnosis not present

## 2018-05-12 DIAGNOSIS — I10 Essential (primary) hypertension: Secondary | ICD-10-CM

## 2018-05-12 DIAGNOSIS — I69351 Hemiplegia and hemiparesis following cerebral infarction affecting right dominant side: Secondary | ICD-10-CM | POA: Diagnosis not present

## 2018-05-12 DIAGNOSIS — I69320 Aphasia following cerebral infarction: Secondary | ICD-10-CM | POA: Diagnosis not present

## 2018-05-12 DIAGNOSIS — E1139 Type 2 diabetes mellitus with other diabetic ophthalmic complication: Secondary | ICD-10-CM | POA: Diagnosis not present

## 2018-05-12 DIAGNOSIS — E785 Hyperlipidemia, unspecified: Secondary | ICD-10-CM | POA: Diagnosis not present

## 2018-05-12 DIAGNOSIS — E1165 Type 2 diabetes mellitus with hyperglycemia: Secondary | ICD-10-CM | POA: Diagnosis present

## 2018-05-12 DIAGNOSIS — Z79899 Other long term (current) drug therapy: Secondary | ICD-10-CM | POA: Diagnosis not present

## 2018-05-12 LAB — COMPREHENSIVE METABOLIC PANEL
ALBUMIN: 3.8 g/dL (ref 3.5–5.0)
ALT: 16 U/L — AB (ref 17–63)
AST: 17 U/L (ref 15–41)
Alkaline Phosphatase: 61 U/L (ref 38–126)
Anion gap: 9 (ref 5–15)
BILIRUBIN TOTAL: 0.5 mg/dL (ref 0.3–1.2)
BUN: 12 mg/dL (ref 6–20)
CHLORIDE: 103 mmol/L (ref 101–111)
CO2: 23 mmol/L (ref 22–32)
CREATININE: 1.13 mg/dL (ref 0.61–1.24)
Calcium: 9.5 mg/dL (ref 8.9–10.3)
GFR calc Af Amer: >60 mL/min (ref >60)
GLUCOSE: 97 mg/dL (ref 65–99)
POTASSIUM: 3.9 mmol/L (ref 3.5–5.1)
Sodium: 135 mmol/L (ref 135–145)
Total Protein: 6.4 g/dL — ABNORMAL LOW (ref 6.5–8.1)

## 2018-05-12 LAB — CBC
HEMATOCRIT: 39 % (ref 39.0–52.0)
Hemoglobin: 13.5 g/dL (ref 13.0–17.0)
MCH: 32.3 pg (ref 26.0–34.0)
MCHC: 34.6 g/dL (ref 30.0–36.0)
MCV: 93.3 fL (ref 78.0–100.0)
PLATELETS: 318 10*3/uL (ref 150–400)
RBC: 4.18 MIL/uL — AB (ref 4.22–5.81)
RDW: 12.1 % (ref 11.5–15.5)
WBC: 6.6 10*3/uL (ref 4.0–10.5)

## 2018-05-12 LAB — ECHOCARDIOGRAM COMPLETE: Height: 64 in

## 2018-05-12 LAB — GLUCOSE, CAPILLARY
GLUCOSE-CAPILLARY: 67 mg/dL (ref 65–99)
GLUCOSE-CAPILLARY: 150 mg/dL — AB (ref 65–99)
Glucose-Capillary: 160 mg/dL — ABNORMAL HIGH (ref 65–99)
Glucose-Capillary: 97 mg/dL (ref 65–99)
Glucose-Capillary: 129 mg/dL — ABNORMAL HIGH (ref 65–99)

## 2018-05-12 LAB — LIPID PANEL
CHOL/HDL RATIO: 4.6 ratio
CHOLESTEROL: 166 mg/dL (ref 0–200)
HDL: 36 mg/dL — AB (ref >40)
LDL CALC: 95 mg/dL (ref 0–99)
TRIGLYCERIDES: 173 mg/dL — AB (ref <150)
VLDL: 35 mg/dL (ref 0–40)

## 2018-05-12 LAB — HEMOGLOBIN A1C
Hgb A1c MFr Bld: 9.2 % — ABNORMAL HIGH (ref 4.8–5.6)
Mean Plasma Glucose: 217.34 mg/dL

## 2018-05-12 MED ORDER — INSULIN ASPART 100 UNIT/ML ~~LOC~~ SOLN
0.0000 [IU] | Freq: Three times a day (TID) | SUBCUTANEOUS | Status: DC
  Administered 2018-05-12 (×2): 1 [IU] via SUBCUTANEOUS
  Administered 2018-05-13 (×2): 2 [IU] via SUBCUTANEOUS

## 2018-05-12 MED ORDER — SODIUM CHLORIDE 0.9 % IV SOLN
INTRAVENOUS | Status: DC
  Administered 2018-05-12 (×2): via INTRAVENOUS

## 2018-05-12 MED ORDER — ACETAMINOPHEN 160 MG/5ML PO SOLN: 650.0000 mg | ORAL | Status: DC | PRN

## 2018-05-12 MED ORDER — NICOTINE 7 MG/24HR TD PT24
7.0000 mg | MEDICATED_PATCH | Freq: Every day | TRANSDERMAL | Status: DC
Start: 2018-05-12 — End: 2018-05-13
  Administered 2018-05-12: 7 mg via TRANSDERMAL
  Filled 2018-05-12 (×2): qty 1

## 2018-05-12 MED ORDER — CLOPIDOGREL BISULFATE 75 MG PO TABS
75.0000 mg | ORAL_TABLET | Freq: Every day | ORAL | Status: DC
  Administered 2018-05-12 – 2018-05-13 (×2): 75 mg via ORAL
  Filled 2018-05-12 (×2): qty 1

## 2018-05-12 MED ORDER — STROKE: EARLY STAGES OF RECOVERY BOOK
Freq: Once | Status: AC
  Administered 2018-05-12: 02:00:00
  Filled 2018-05-12: qty 1

## 2018-05-12 MED ORDER — HYDRALAZINE HCL 20 MG/ML IJ SOLN: 5.0000 mg | Freq: Three times a day (TID) | INTRAMUSCULAR | Status: DC | PRN

## 2018-05-12 MED ORDER — ASPIRIN EC 325 MG PO TBEC
325.0000 mg | DELAYED_RELEASE_TABLET | Freq: Every day | ORAL | Status: DC
  Administered 2018-05-13: 325 mg via ORAL
  Filled 2018-05-12: qty 1

## 2018-05-12 MED ORDER — ENOXAPARIN SODIUM 40 MG/0.4ML ~~LOC~~ SOLN
40.0000 mg | SUBCUTANEOUS | Status: DC
  Administered 2018-05-12: 40 mg via SUBCUTANEOUS
  Filled 2018-05-12: qty 0.4

## 2018-05-12 MED ORDER — ASPIRIN EC 81 MG PO TBEC
81.0000 mg | DELAYED_RELEASE_TABLET | Freq: Every day | ORAL | Status: DC
  Administered 2018-05-12: 81 mg via ORAL
  Filled 2018-05-12: qty 1

## 2018-05-12 MED ORDER — LIVING WELL WITH DIABETES BOOK
Freq: Once | Status: AC
  Administered 2018-05-12: 12:00:00
  Filled 2018-05-12: qty 1

## 2018-05-12 MED ORDER — ACETAMINOPHEN 650 MG RE SUPP: 650.0000 mg | RECTAL | Status: DC | PRN

## 2018-05-12 MED ORDER — HYDROCHLOROTHIAZIDE 25 MG PO TABS
25.0000 mg | ORAL_TABLET | Freq: Every day | ORAL | Status: DC
  Administered 2018-05-12 – 2018-05-13 (×2): 25 mg via ORAL
  Filled 2018-05-12 (×2): qty 1

## 2018-05-12 MED ORDER — ATORVASTATIN CALCIUM 80 MG PO TABS
80.0000 mg | ORAL_TABLET | Freq: Every day | ORAL | Status: DC
  Administered 2018-05-12 – 2018-05-13 (×2): 80 mg via ORAL
  Filled 2018-05-12 (×2): qty 1

## 2018-05-12 MED ORDER — SENNOSIDES-DOCUSATE SODIUM 8.6-50 MG PO TABS: 1.0000 | ORAL_TABLET | Freq: Every evening | ORAL | Status: DC | PRN

## 2018-05-12 MED ORDER — ACETAMINOPHEN 325 MG PO TABS: 650.0000 mg | ORAL_TABLET | ORAL | Status: DC | PRN

## 2018-05-12 MED ORDER — IOPAMIDOL (ISOVUE-370) INJECTION 76%
100.0000 mL | Freq: Once | INTRAVENOUS | Status: AC | PRN
  Administered 2018-05-12: 100 mL via INTRAVENOUS

## 2018-05-12 NOTE — Progress Notes (Signed)
Patient seen and examined at bedside, patient admitted after midnight, please see earlier detailed admission note by Rometta Emery, MD. Briefly, patient presented with aphasia found to have multiple infarcts concerning for embolic stroke. Deficits improved. Some confusion. Workup in progress.   Jacquelin Hawking, MD [facility] 05/12/2018, 12:01 PM Pager: [phone]

## 2018-05-12 NOTE — Plan of Care (Signed)
  Problem: Clinical Measurements: Goal: Ability to maintain clinical measurements within normal limits will improve Outcome: Progressing   Problem: Education: Goal: Knowledge of disease or condition will improve Outcome: Progressing   Problem: Coping: Goal: Will verbalize positive feelings about self Outcome: Not Progressing

## 2018-05-12 NOTE — Progress Notes (Signed)
  Echocardiogram 2D Echocardiogram has been performed.  Belva Chimes 05/12/2018, 2:35 PM

## 2018-05-12 NOTE — Consult Note (Signed)
Neurology Consultation Reason for Consult: Stroke Referring Physician: Darlyn Chamber  CC: Speech difficulty  History is obtained from: Patient  HPI: Bradley Hall is a 52 y.o. male with a history of diabetes, hypertension who presents with new onset difficulty with speech.  He is a poor historian, but according to the chart, he has been having difficulty with speech over the course the day.  Apparently he has had some improvement.  He continues to have some mild word finding difficulty on my exam.  He states that he may have been having some difficulty with reading for even a few weeks.   LKW: Unclear tpa given?: no, unclear time of onset    ROS: A 14 point ROS was performed and is negative except as noted in the HPI.   Past Medical History:  Diagnosis Date  . Diabetes mellitus   . Hyperlipidemia   . Hypertension   . Stroke [facility])      Family history: No history of stroke   Social History:  reports that he has been smoking.  He has been smoking about 0.50 packs per day. He does not have any smokeless tobacco history on file. He reports that he drinks about 1.5 oz of alcohol per week. He reports that he does not use drugs.   Exam: Current vital signs: BP 136/83 (BP Location: Left Arm)   Pulse 71   Temp 98.6 F (37 C) (Oral)   Resp 19   Ht (P)  (1.626 m)   SpO2 98%   BMI (P) 19.60 kg/m  Vital signs in last 24 hours: Temp:  [98.5 F (36.9 C)-98.6 F (37 C)] 98.6 F (37 C) (05/19 0108) Pulse Rate:  [67-101] 71 (05/19 0108) Resp:  [11-20] 19 (05/19 0108) BP: (117-143)/(61-89) 136/83 (05/19 0108) SpO2:  [97 %-100 %] 98 % (05/19 0108)   Physical Exam  Constitutional: Appears well-developed and well-nourished.  Psych: Affect appropriate to situation Eyes: No scleral injection HENT: No OP obstrucion Head: Normocephalic.  Cardiovascular: Normal rate and regular rhythm.  Respiratory: Effort normal, non-labored breathing GI: Soft.  No distension. There is no  tenderness.  Skin: WDI  Neuro: Mental Status: Patient is awake, alert, gives the month is May, but the year is 2000 He has difficulty naming objects, and is only able to read a single word that I show him Cranial Nerves: II: Visual Fields are full in the right eye, decreased vision in the left eye. Pupils are equal, round, and reactive to light.   III,IV, VI: He has disconjugate gaze(baseline per patient) but has good versions V: Facial sensation is symmetric to temperature VII: Facial movement is symmetric.  VIII: hearing is intact to voice X: Uvula elevates symmetrically XI: Shoulder shrug is symmetric. XII: tongue is midline without atrophy or fasciculations.  Motor: Tone is normal. Bulk is normal.  He has 4+/5 strength of the right arm and 4/5 strength in the right leg Sensory: Sensation is symmetric to light touch and temperature in the arms and legs. Cerebellar: No clear ataxia     I have reviewed labs in epic and the results pertinent to this consultation are: CMP-mild hyponatremia at 128  I have reviewed the images obtained: MRI brain-embolic appearing infarcts in the right splenium  Impression: 52 year old male with waxing/waning mental status.  I wonder if the deficits that I appreciated on exam are chronic given that he has had right-sided weakness with previous strokes as well as aphasia.  It is possible that his current  strokes of unmasked previous deficits rather than the strokes themselves being responsible for his current symptoms.  My evaluation of his speech difficulties hampered by the fact that he is not primary [place] speaking, but he does appear to have some word finding difficulty.  The stroke seen on MRI does appear to be embolic and further work-up as needed.  Recommendations: 1) lipid panel, A1c 2) echo, telemetry 3) PT, OT, ST 4) stroke team to follow   Ritta Slot, MD [facility] [phone]  If 7pm- 7am, please page  neurology on call as listed in AMION.

## 2018-05-12 NOTE — Progress Notes (Signed)
STROKE TEAM PROGRESS NOTE   SUBJECTIVE (INTERVAL HISTORY) His wife is at the bedside.  Pt has very poor vision. Talkative, but as per wife, he is not compliant with medication, not go to doctors appointment, not check glucose and BP at home. Continues to smoke and drink.    OBJECTIVE Temp:  [98.1 F (36.7 C)-98.6 F (37 C)] 98.1 F (36.7 C) (05/19 0402) Pulse Rate:  [67-101] 76 (05/19 0737) Cardiac Rhythm: Normal sinus rhythm (05/19 0701) Resp:  [11-21] 16 (05/19 0737) BP: (117-143)/(61-105) 121/77 (05/19 0737) SpO2:  [97 %-100 %] 100 % (05/19 0737)  CBC:  Recent Labs  Lab 05/11/18 1631 05/11/18 1637 05/12/18 0508  WBC 9.0  --  6.6  NEUTROABS 6.0  --   --   HGB 13.9 14.6 13.5  HCT 40.8 43.0 39.0  MCV 93.8  --  93.3  PLT 326  --  318    Basic Metabolic Panel:  Recent Labs  Lab 05/11/18 1631 05/11/18 1637 05/12/18 0508  NA 128* 132* 135  K 3.9 4.0 3.9  CL 98* 98* 103  CO2 19*  --  23  GLUCOSE 72 69 97  BUN CREATININE 1.30* 1.10 1.13  CALCIUM 9.4  --  9.5    Lipid Panel:     Component Value Date/Time   CHOL 166 05/12/2018 0508   TRIG 173 (H) 05/12/2018 0508   HDL 36 (L) 05/12/2018 0508   CHOLHDL 4.6 05/12/2018 0508   VLDL 35 05/12/2018 0508   LDLCALC 95 05/12/2018 0508   HgbA1c:  Lab Results  Component Value Date   HGBA1C 9.2 (H) 05/12/2018   Urine Drug Screen:     Component Value Date/Time   LABOPIA NONE DETECTED 05/11/2018 2041   COCAINSCRNUR NONE DETECTED 05/11/2018 2041   LABBENZ NONE DETECTED 05/11/2018 2041   AMPHETMU NONE DETECTED 05/11/2018 2041   THCU NONE DETECTED 05/11/2018 2041   LABBARB NONE DETECTED 05/11/2018 2041    Alcohol Level     Component Value Date/Time   ETH <10 05/11/2018 1631    IMAGING I have personally reviewed the radiological images below and agree with the radiology interpretations.  Ct Angio Head W Or Wo Contrast Ct Angio Neck W Or Wo Contrast Ct Cerebral Perfusion W  Contrast 05/12/2018 IMPRESSION:  1. Negative CTA of the head and neck with no evidence for large vessel occlusion.  2. No acute ischemia or other perfusion abnormality evident by CT perfusion.  3. Atheromatous plaque at the right carotid siphon with resultant mild to moderate multifocal stenosis.  4. Short-segment atheromatous stenosis of approximately 40% at the proximal left subclavian artery.  5. Otherwise negative CTA of the head and neck with relatively minor atherosclerotic change for patient age. No significant vascular irregularity to suggest vaso spasm, vasculitis, or other vascular abnormality.   Ct Head Wo Contrast 05/11/2018 IMPRESSION:  No acute intracranial abnormality. Atrophy, chronic microvascular disease. Old bilateral lacunar infarcts as above.   Mr Brain Wo Contrast (neuro Protocol) 05/11/2018 IMPRESSION:  1. Patchy small volume acute ischemic nonhemorrhagic infarcts involving the right splenium and parasagittal right occipital lobe.  2. Atrophy with advanced chronic small vessel ischemic disease with multiple remote lacunar infarcts involving the bilateral basal ganglia/corona radiata, thalami, and pons.   Transthoracic Echocardiogram - pending   PHYSICAL EXAM Temp:  [98.1 F (36.7 C)-98.6 F (37 C)] 98.4 F (36.9 C) (05/19 1122) Pulse Rate:  [67-101] 74 (05/19 1122) Resp:  [11-21] 20 (05/19 1122) BP: (117-143)/(61-105)  130/86 (05/19 1122) SpO2:  [97 %-100 %] 100 % (05/19 1122)  General - Well nourished, well developed, in no apparent distress.  Ophthalmologic - Fundi not visualized due to noncooperation.  Cardiovascular - Regular rate and rhythm.  Mental Status -  Level of arousal and orientation to time, place, and person were intact. Language including expression, naming, repetition, comprehension was assessed and found intact. Mild dysarthria  Cranial Nerves II - XII - II - left eye total blind, right eye decreased visual acuity to FC, visual field  full. III, IV, VI - Extraocular movements intact. V - Facial sensation intact bilaterally. VII - Facial movement intact bilaterally. VIII - Hearing & vestibular intact bilaterally. X - Palate elevates symmetrically. XI - Chin turning & shoulder shrug intact bilaterally. XII - Tongue protrusion intact.  Motor Strength - The patient's strength was normal in all extremities and pronator drift was absent.  Bulk was normal and fasciculations were absent.   Motor Tone - Muscle tone was assessed at the neck and appendages and was normal.  Reflexes - The patient's reflexes were 1+ in all extremities and he had no pathological reflexes.  Sensory - Light touch, temperature/pinprick were assessed and were symmetrical.    Coordination - The patient had normal movements in the hands with no ataxia or dysmetria.  Tremor was absent.  Gait and Station - deferred   ASSESSMENT/PLAN [name] is a 52 y.o. male with history of previous strokes, hypertension, hyperlipidemia, and diabetes mellitus presenting with speech difficulties. He did not receive IV t-PA due unclear time of onset.  Strokes: scattered punctate right PCA infarcts, likely due to multifocal intracranial athero in the setting of uncontrolled risk factors. However, cardioembolic not ruled out.  Resultant  Baseline poor vision  CT head - No acute intracranial abnormality. Old bilateral lacunar infarcts  MRI head -  Patchy small volume acute ischemic nonhemorrhagic infarcts involving the right splenium and parasagittal right occipital lobe. Multiple remote infarcts.  CTA H&N - several areas of mild to moderate stenosis.  2D Echo - pending  Recommend 30 day cardiac event monitoring as outpt to rule out afib  LDL - 95  HgbA1c - 9.2  VTE prophylaxis - SCDs  aspirin 81 mg daily prior to admission, now on aspirin 325 mg daily and clopidogrel 75 mg daily. Continue ASA 325 and plavix DAPT for 3 months due to intracranial  stenosis and then plavix alone.  Patient counseled to be compliant with his antithrombotic medications  Ongoing aggressive stroke risk factor management  Therapy recommendations:  No F/U OT  Disposition:  Pending  History of stroke  11/03/11 - slurry speech and right sided weakness - MRI left thalamic/IC infarct - MRA neg  10/03/13 - aphasia - MRI showed left CR, left thalamus, left MCA/PCA infarcts - MRA b/l ACA stenosis, diffuse athero including b/l siphons - CUS neg, EF 55-60%, LDL 185 and A1C 8.1 - on ASA  Medication noncompliance  Not check BP and glucose at home  Not taking medication as instructed  Not follow up with doctors as scheduled  Continue to smoke and drink  Poorly controlled risk factors  Hypertension  Stable . Permissive hypertension (OK if < 220/120) but gradually normalize in 5-7 days . Long-term BP goal normotensive  Hyperlipidemia  Lipid lowering medication PTA: Lipitor 80 mg daily  LDL 95, goal < 70  Continue Lipitor 80 mg daily  Continue statin at discharge  Diabetes  HgbA1c 9.2, goal < 7.0  Uncontrolled  SSI  CBG monitoring  PCP close follow up  Tobacco abuse  Current smoker  Smoking cessation counseling provided  Other Stroke Risk Factors  ETOH use, advised to drink no more than 1 alcoholic beverage per day.  Medication noncompliance  Other Active Problems  Mild hyponatremia  Hospital day # 0  Neurology will sign off. Please call with questions. Pt will follow up with stroke clinic [facility] at [facility] in about 4 weeks. Thanks for the consult.  Marvel Plan, MD PhD Stroke Neurology 05/12/2018 4:13 PM   To contact Stroke Continuity provider, please refer to [email or web address]. After hours, contact General Neurology

## 2018-05-12 NOTE — Plan of Care (Signed)
The patient is anxious to go home.  However, I expressed concern to Dr. Caleb Popp that Mr. Bulman has poor safety awareness, impulsiveness and poor judgment as evidenced by inappropriate words and behaviors.  His wife, who is a Engineer, civil (consulting), states that the right side weakness and ataxia are the patient's baseline.  His vision, especially from the left, is worsening.  I witnessed the patient ambulate to the bathroom and have difficulty finding the wall--as if poor depth perception--and reduced fine motor skills with his gown, IV line and telemetry monitor.  The patient expresses concern that he will have difficulty complying with Plavix because "you have to visit the doctor, and that costs money."  He is a current smoker and former ETOH user.  Plan of care will be to maximize safety, progressive mobility and extensive education of modifiable risk factors.  At this point, I question his motivation to comply with treatment and risk modification, but perhaps his wife will encourage/support him.

## 2018-05-12 NOTE — Evaluation (Signed)
Physical Therapy Evaluation Patient Details Name: Bradley Hall MRN: [id number] DOB: [birth year] Today's Date: 05/12/2018   History of Present Illness  52 y.o. male with medical history significant of previous CVA, diabetes, hypertension and previous alcohol abuse history who was brought in from home with aphasia. Patient started having speech problems with no significant focal weakness.  Speech is slowly improving. Patient is still confused little but he is gaining his mental capacity. He was altered when he came in.  Clinical Impression  Orders received for PT evaluation. Patient demonstrates deficits in functional mobility as indicated below. Will benefit from continued skilled PT to address deficits and maximize function. Will see as indicated and progress as tolerated.      Follow Up Recommendations Supervision/Assistance - 24 hour;Home health PT    Equipment Recommendations  None recommended by PT    Recommendations for Other Services       Precautions / Restrictions Precautions Precautions: Fall Restrictions Weight Bearing Restrictions: No      Mobility  Bed Mobility Overal bed mobility: Needs Assistance Bed Mobility: Supine to Sit;Sit to Supine     Supine to sit: Supervision Sit to supine: Supervision   General bed mobility comments: supervision for safety and line management  Transfers Overall transfer level: Needs assistance   Transfers: Sit to/from Stand Sit to Stand: Min guard         General transfer comment: Min guard for stability  Ambulation/Gait Ambulation/Gait assistance: Min assist Ambulation Distance (Feet): 210 Feet Assistive device: None Gait Pattern/deviations: Step-through pattern;Decreased stride length;Drifts right/left;Narrow base of support Gait velocity: decreased   General Gait Details: multiple LOB and instability, poor insight and awareness. Max cues for impulsivity and safety  Stairs Stairs: Yes Stairs assistance: Min  assist Stair Management: Two rails Number of Stairs: 3 General stair comments: Hands on assist required  Wheelchair Mobility    Modified Rankin (Stroke Patients Only) Modified Rankin (Stroke Patients Only) Pre-Morbid Rankin Score: Moderate disability Modified Rankin: Moderately severe disability     Balance Overall balance assessment: Needs assistance   Sitting balance-Leahy Scale: Good     Standing balance support: During functional activity Standing balance-Leahy Scale: Poor Standing balance comment: noted instability requiring hands on assist                             Pertinent Vitals/Pain Pain Assessment: No/denies pain    Home Living Family/patient expects to be discharged to:: Private residence Living Arrangements: Spouse/significant other;Children Available Help at Discharge: Available PRN/intermittently Type of Home: House Home Access: Stairs to enter Entrance Stairs-Rails: None Secretary/administrator of Steps: 2 STEPS Home Layout: Two level Home Equipment: None Additional Comments: PATIENT STATES THAT FAMILY WORKS    Prior Function Level of Independence: Needs assistance   Gait / Transfers Assistance Needed: supervision with mobility     Comments: per wife, baseline deficits in mobility from previous stroke     Hand Dominance   Dominant Hand: Right    Extremity/Trunk Assessment   Upper Extremity Assessment RUE Deficits / Details: DECREASED STERNGTH 4-/5 RUE Coordination: decreased fine motor;decreased gross motor(ABLE TO USE AS GROSS ASSIST)    Lower Extremity Assessment Lower Extremity Assessment: RLE deficits/detail RLE Deficits / Details: noted decreased strength and functional coordination RLE RLE Coordination: decreased fine motor;decreased gross motor       Communication   Communication: (EXTRA TIME TO ANSWER QUESTIONS, ? LANGUAGE BARRIER)  Cognition Arousal/Alertness: Awake/alert Behavior During Therapy:  Impulsive Overall  Cognitive Status: Impaired/Different from baseline Area of Impairment: Orientation;Attention;Memory;Following commands;Safety/judgement;Awareness;Problem solving                 Orientation Level: Disoriented to;Situation Current Attention Level: Sustained Memory: Decreased short-term memory Following Commands: Follows one step commands inconsistently;Follows one step commands with increased time Safety/Judgement: Decreased awareness of safety;Decreased awareness of deficits Awareness: Emergent Problem Solving: Slow processing;Difficulty sequencing;Requires verbal cues;Requires tactile cues        General Comments      Exercises     Assessment/Plan    PT Assessment Patient needs continued PT services  PT Problem List Decreased strength;Decreased activity tolerance;Decreased balance;Decreased mobility;Decreased coordination;Decreased cognition;Decreased safety awareness       PT Treatment Interventions DME instruction;Gait training;Functional mobility training;Stair training;Therapeutic activities;Therapeutic exercise;Balance training;Cognitive remediation;Patient/family education    PT Goals (Current goals can be found in the Care Plan section)  Acute Rehab PT Goals Patient Stated Goal: to go home PT Goal Formulation: With patient/family Time For Goal Achievement: 05/26/18 Potential to Achieve Goals: Good    Frequency Min 3X/week   Barriers to discharge        Co-evaluation               AM-PAC PT "6 Clicks" Daily Activity  Outcome Measure Difficulty turning over in bed (including adjusting bedclothes, sheets and blankets)?: A Little Difficulty moving from lying on back to sitting on the side of the bed? : A Little Difficulty sitting down on and standing up from a chair with arms (e.g., wheelchair, bedside commode, etc,.)?: Unable Help needed moving to and from a bed to chair (including a wheelchair)?: A Little Help needed walking in  hospital room?: A Little Help needed climbing 3-5 steps with a railing? : A Little 6 Click Score: 16    End of Session Equipment Utilized During Treatment: Gait belt Activity Tolerance: Patient tolerated treatment well Patient left: in bed;with call bell/phone within reach;with bed alarm set;with family/visitor present Nurse Communication: Mobility status PT Visit Diagnosis: Unsteadiness on feet (R26.81);Difficulty in walking, not elsewhere classified (R26.2);Other symptoms and signs involving the nervous system (R29.898)    Time: 1610-9604 PT Time Calculation (min) (ACUTE ONLY): 21 min   Charges:   PT Evaluation $PT Eval Moderate Complexity: 1 Mod     PT G Codes:        Charlotte Crumb, PT DPT  Board Certified Neurologic Specialist [phone]   Fabio Asa 05/12/2018, 4:03 PM

## 2018-05-12 NOTE — H&P (Signed)
History and Physical    Bradley Hall UJW:[id number] DOB: 13-Dec-1966 DOA: 05/11/2018  Referring MD/NP/PA: Dr Deretha Emory  PCP: Ralene Ok, MD   Outpatient Specialists: None  Patient coming from: home  Chief Complaint: Aphasia  HPI: Bradley Hall is a 52 y.o. male with medical history significant of previous CVA, diabetes, hypertension and previous alcohol abuse history who was brought in from home with aphasia. Patient started having speech problems with no significant focal weakness.  Speech is slowly improving. Patient is still confused little but he is gaining his mental capacity. He was altered when he came in.  ED Course: patient has a temperature 98.5 blood pressure 142/86 pulse 101 at rest rate of 20. His oxygen status 100% room air. Normal CBCs but sodium 132 chloride is 98 and CO2 of 19. Sodium was 128 on arrival and head CT without contrast was negative. MRI of the brain confirmed patchy acute ischemic nonhemorrhagic infarcts in the right splenium and parasagittal right occipital lobe  Review of Systems: As per HPI otherwise 10 point review of systems negative.    Past Medical History:  Diagnosis Date  . Diabetes mellitus   . Hyperlipidemia   . Hypertension   . Stroke [facility])     Past Surgical History:  Procedure Laterality Date  . NO PAST SURGERIES       reports that he has been smoking.  He has been smoking about 0.50 packs per day. He does not have any smokeless tobacco history on file. He reports that he drinks about 1.5 oz of alcohol per week. He reports that he does not use drugs.  No Known Allergies  No family history on file.   Prior to Admission medications   Medication Sig Start Date End Date Taking? Authorizing Provider  aspirin EC 81 MG tablet Take 1 tablet (81 mg total) by mouth daily. 10/03/13   Ozella Rocks, MD  atorvastatin (LIPITOR) 80 MG tablet Take 1 tablet (80 mg total) by mouth daily. 10/03/13   Ozella Rocks, MD  hydrochlorothiazide  (HYDRODIURIL) 25 MG tablet Take 1 tablet (25 mg total) by mouth daily. 11/03/11 11/02/12  Dorothea Ogle, MD  metFORMIN (GLUCOPHAGE) 850 MG tablet Take 1 tablet (850 mg total) by mouth 2 (two) times daily with a meal. 11/03/11 11/02/12  Dorothea Ogle, MD    Physical Exam: Vitals:   05/11/18 1900 05/11/18 1930 05/11/18 2000 05/12/18 0000  BP: 130/81 122/75 140/89 (!) 141/121  Pulse: 73 74 83 (!) 109  Resp: Temp:      TempSrc:      SpO2: 99% 98% 100% 97%      Constitutional: NAD, calm, comfortable Vitals:   05/11/18 1900 05/11/18 1930 05/11/18 2000 05/12/18 0000  BP: 130/81 122/75 140/89 (!) 141/121  Pulse: 73 74 83 (!) 109  Resp: Temp:      TempSrc:      SpO2: 99% 98% 100% 97%   Eyes: PERRL, lids and conjunctivae normal ENMT: Mucous membranes are moist. Posterior pharynx clear of any exudate or lesions.Normal dentition.  Neck: normal, supple, no masses, no thyromegaly Respiratory: clear to auscultation bilaterally, no wheezing, no crackles. Normal respiratory effort. No accessory muscle use.  Cardiovascular: Regular rate and rhythm, no murmurs / rubs / gallops. No extremity edema. 2+ pedal pulses. No carotid bruits.  Abdomen: no tenderness, no masses palpated. No hepatosplenomegaly. Bowel sounds positive.  Musculoskeletal: no clubbing / cyanosis. No joint deformity upper  and lower extremities. Good ROM, no contractures. Normal muscle tone.  Skin: no rashes, lesions, ulcers. No induration Neurologic: CN 2-12 grossly intact. Sensation intact, DTR normal. Strength 5/5 in all 4. Speech is weak Psychiatric: Normal judgment and insight. Alert and oriented x 3. Normal mood.   Labs on Admission: I have personally reviewed following labs and imaging studies  CBC: Recent Labs  Lab 05/11/18 1631 05/11/18 1637  WBC 9.0  --   NEUTROABS 6.0  --   HGB 13.9 14.6  HCT 40.8 43.0  MCV 93.8  --   PLT 326  --    Basic Metabolic Panel: Recent Labs  Lab  05/11/18 1631 05/11/18 1637  NA 128* 132*  K 3.9 4.0  CL 98* 98*  CO2 19*  --   GLUCOSE 72 69  BUN 14 17  CREATININE 1.30* 1.10  CALCIUM 9.4  --    GFR: CrCl cannot be calculated (Unknown ideal weight.). Liver Function Tests: Recent Labs  Lab 05/11/18 1631  AST 22  ALT 18  ALKPHOS 66  BILITOT 0.6  PROT 7.1  ALBUMIN 4.2   No results for input(s): LIPASE, AMYLASE in the last 168 hours. No results for input(s): AMMONIA in the last 168 hours. Coagulation Profile: Recent Labs  Lab 05/11/18 1631  INR 0.96   Cardiac Enzymes: No results for input(s): CKTOTAL, CKMB, CKMBINDEX, TROPONINI in the last 168 hours. BNP (last 3 results) No results for input(s): PROBNP in the last 8760 hours. HbA1C: No results for input(s): HGBA1C in the last 72 hours. CBG: No results for input(s): GLUCAP in the last 168 hours. Lipid Profile: No results for input(s): CHOL, HDL, LDLCALC, TRIG, CHOLHDL, LDLDIRECT in the last 72 hours. Thyroid Function Tests: No results for input(s): TSH, T4TOTAL, FREET4, T3FREE, THYROIDAB in the last 72 hours. Anemia Panel: No results for input(s): VITAMINB12, FOLATE, FERRITIN, TIBC, IRON, RETICCTPCT in the last 72 hours. Urine analysis:    Component Value Date/Time   COLORURINE YELLOW 05/11/2018 2040   APPEARANCEUR CLEAR 05/11/2018 2040   LABSPEC 1.011 05/11/2018 2040   PHURINE 5.0 05/11/2018 2040   GLUCOSEU NEGATIVE 05/11/2018 2040   HGBUR NEGATIVE 05/11/2018 2040   BILIRUBINUR NEGATIVE 05/11/2018 2040   KETONESUR 5 (A) 05/11/2018 2040   PROTEINUR NEGATIVE 05/11/2018 2040   UROBILINOGEN 0.2 10/02/2013 1949   NITRITE NEGATIVE 05/11/2018 2040   LEUKOCYTESUR TRACE (A) 05/11/2018 2040   Sepsis Labs: (procalcitonin:4,lacticidven:4) )No results found for this or any previous visit (from the past 240 hour(s)).   Radiological Exams on Admission: Dg Chest 2 View  Result Date: 05/11/2018 CLINICAL DATA:  Altered mental status.  History of stroke.  EXAM: CHEST - 2 VIEW COMPARISON:  Chest radiograph October 02, 2013 FINDINGS: Cardiomediastinal silhouette is normal. No pleural effusions or focal consolidations. Trachea projects midline and there is no pneumothorax. Soft tissue planes and included osseous structures are non-suspicious. IMPRESSION: Negative. Electronically Signed   By: Awilda Metro M.D.   On: 05/11/2018 17:36   Ct Head Wo Contrast  Result Date: 05/11/2018 CLINICAL DATA:  Altered mental status EXAM: CT HEAD WITHOUT CONTRAST TECHNIQUE: Contiguous axial images were obtained from the base of the skull through the vertex without intravenous contrast. COMPARISON:  MRI 10/03/2013 FINDINGS: Brain: There is atrophy and chronic small vessel disease changes. Multiple old bilateral basal ganglia and thalamic lacunar infarcts. No acute intracranial abnormality. Specifically, no hemorrhage, hydrocephalus, mass lesion, acute infarction, or significant intracranial injury. Vascular: No hyperdense vessel or unexpected calcification. Skull: No acute calvarial  abnormality. Sinuses/Orbits: Visualized paranasal sinuses and mastoids clear. Orbital soft tissues unremarkable. Other: None IMPRESSION: No acute intracranial abnormality. Atrophy, chronic microvascular disease. Old bilateral lacunar infarcts as above. Electronically Signed   By: Charlett Nose M.D.   On: 05/11/2018 17:07   Mr Brain Wo Contrast (neuro Protocol)  Result Date: 05/11/2018 CLINICAL DATA:  Initial evaluation for acute confusion, aphasia. EXAM: MRI HEAD WITHOUT CONTRAST TECHNIQUE: Multiplanar, multiecho pulse sequences of the brain and surrounding structures were obtained without intravenous contrast. COMPARISON:  Prior CT from earlier the same day as well as previous MRI from 10/03/2013 FINDINGS: Brain: Study somewhat limited as the patient was unable to tolerate the full length of the exam, and the studies provided are mildly degraded by motion artifact. Diffuse prominence of the CSF  containing spaces compatible with generalized cerebral atrophy. Patchy and confluent T2/FLAIR hyperintensity within the periventricular and deep white matter both cerebral hemispheres most compatible with chronic small vessel ischemic change, advanced for age. Chronic microvascular ischemic changes present within the pons. Multiple superimposed remote lacunar infarcts present within the bilateral basal ganglia/corona radiata, thalami, pons, and left middle cerebellar peduncle. There is patchy small volume restricted diffusion within the right splenium and parasagittal right occipital lobe (series 5001, image 63, 61, 59), consistent with acute ischemic infarct, right PCA territory. No associated hemorrhage or mass effect. No other evidence for acute or subacute ischemia. Gray-white matter differentiation otherwise maintained. No acute or chronic intracranial hemorrhage. No mass lesion, midline shift or mass effect. No hydrocephalus. No extra-axial fluid collection. Major dural sinuses are grossly patent. Pituitary gland suprasellar region normal. Midline structures intact and normal. Vascular: Major intracranial vascular flow voids are maintained. Skull and upper cervical spine: Craniocervical junction within normal limits. Visualized upper cervical spine demonstrates no acute finding. Bone marrow signal intensity normal. No scalp soft tissue abnormality. Sinuses/Orbits: Globes and orbital soft tissues within normal limits. Mild scattered mucosal thickening within the ethmoidal air cells. Paranasal sinuses are otherwise clear. No mastoid effusion. Inner ear structures normal. Other: None. IMPRESSION: 1. Patchy small volume acute ischemic nonhemorrhagic infarcts involving the right splenium and parasagittal right occipital lobe. 2. Atrophy with advanced chronic small vessel ischemic disease with multiple remote lacunar infarcts involving the bilateral basal ganglia/corona radiata, thalami, and pons. Electronically  Signed   By: Rise Mu M.D.   On: 05/11/2018 21:36    EKG: Independently reviewed. Shows normal rhythm, rate is 18 with left anterior first lab block no ST changes  Assessment/Plan Principal Problem:   Cerebral infarction (HCC) Active Problems:   HTN (hypertension)   HLD (hyperlipidemia)   Diabetes mellitus (HCC)   Acute CVA (cerebrovascular accident) (HCC)    #1 acute CVA: Patient came in outside the window for TPA administration. Neurology already involved. Getting CT angiogram of the head and neck currently pending. Patient was already on statin and low-dose aspirin. We will defer to neurology further recommendations.  #2 diabetes: Continue sliding scale insulin. Hold metformin.  #3 hypertension: Permissive hypertension for now. Resume home regimen as necessary.  #4 hyperlipidemia: Continue statin  #5 hyponatremia: Patient has a remote history of alcohol abuse. Not sure if he still drinks. We will hydrate patient and monitor sodium level.   DVT prophylaxis: Lovenox  Code Status: full code  Family Communication: Huey Bienenstock, Wife  Disposition Plan: to be determined  Consults called: Neurology, Dr Amada Jupiter  Admission status: inpatient   Severity of Illness: The appropriate patient status for this patient is INPATIENT. Inpatient status is judged  to be reasonable and necessary in order to provide the required intensity of service to ensure the patient's safety. The patient's presenting symptoms, physical exam findings, and initial radiographic and laboratory data in the context of their chronic comorbidities is felt to place them at high risk for further clinical deterioration. Furthermore, it is not anticipated that the patient will be medically stable for discharge from the hospital within 2 midnights of admission. The following factors support the patient status of inpatient.   " The patient's presenting symptoms include aphasia. " The worrisome physical exam  findings include stattered speech. " The initial radiographic and laboratory data are worrisome because of MRI showing acute CVA. " The chronic co-morbidities include diabetes with hypertension and previous stroke.   * I certify that at the point of admission it is my clinical judgment that the patient will require inpatient hospital care spanning beyond 2 midnights from the point of admission due to high intensity of service, high risk for further deterioration and high frequency of surveillance required.Lonia Blood MD [facility] Pager [phone]  If 7PM-7AM, please contact night-coverage [email or web address] Password [email or web address]  05/12/2018, 12:13 AM

## 2018-05-12 NOTE — Evaluation (Signed)
Occupational Therapy Evaluation Patient Details Name: Bradley Hall MRN: [id number] DOB: [birth year] Today's Date: 05/12/2018    History of Present Illness PATIENT WAS ADMITTED FOR INCREASED DIFFICULTY WITH SPEECH AND IS BEING WORKED UP FOR NEW CVA. PATIENT PMH: PREVIOUS CVA WITH R SIDED WEAKNESS, DECREASED VISON, AND APHASIA, DM, HTN.   Clinical Impression   PATIENT DOES NOT APPEAR TO BE AN ACCURATE HISTORIAN. PATIENT STATES HE IS HOME ALONE WHEN FAMILY IS AT WORK. PATIENT HAS VERY UNSTEADY GAIT WHICH HE STATES IS BECAUSE HE CAN'T SEE HERE. PATIENT STATES HE HAS HAD DECREASED VISION SINCE PREVIOUS CVA. PATIENT HAS NO VISION IN L AND LIMITED IN RIGHT EYE. PATIENT NEEDS MAGNIFIER TO SEE PRINT. PATIENT APPEARS TO SEE OBJECTS AND CAN REACH FOR OBJECTS. PATIENT HAS DECREASED INSIGHT AND IS IMPULSIVE.     Follow Up Recommendations  Supervision/Assistance - 24 hour    Equipment Recommendations  Tub/shower seat    Recommendations for Other Services       Precautions / Restrictions Precautions Precautions: Fall Restrictions Weight Bearing Restrictions: No      Mobility Bed Mobility Overal bed mobility: Needs Assistance             General bed mobility comments: S FOR BED MOBILITY  Transfers Overall transfer level: Needs assistance   Transfers: Sit to/from Stand Sit to Stand: Min guard              Balance                                           ADL either performed or assessed with clinical judgement   ADL                                               Vision Baseline Vision/History: 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 PNT HAS POOR VISION IN R EYE) Patient Visual Report: No change from baseline Vision Assessment?: Vision impaired- to be further tested in functional context Additional Comments: PATIENT HAS DIFFICULTY NAVIGATING IN NEW ENVIRONEMENT. PATIENT USES MAGNIFIER TO READ PRINT AND TO USE PHONE.      Perception      Praxis      Pertinent Vitals/Pain Pain Assessment: No/denies pain     Hand Dominance Right   Extremity/Trunk Assessment Upper Extremity Assessment Upper Extremity Assessment: RUE deficits/detail RUE Deficits / Details: DECREASED STERNGTH 4-/5 RUE Coordination: decreased fine motor;decreased gross motor(ABLE TO USE AS GROSS ASSIST)           Communication Communication Communication: (EXTRA TIME TO ANSWER QUESTIONS, ? LANGUAGE BARRIER)   Cognition Arousal/Alertness: Awake/alert Behavior During Therapy: Impulsive Overall Cognitive Status: No family/caregiver present to determine baseline cognitive functioning                                 General Comments: DECREASED INSIGHT TO DEFICITS. EXTRA TIME REQUIRED TO ANSWER QUESTIONS.   General Comments       Exercises     Shoulder Instructions      Home Living Family/patient expects to be discharged to:: Private residence Living Arrangements: Spouse/significant other;Children Available Help at Discharge: Available PRN/intermittently Type of Home: House Home Access: Stairs to enter [employer] of Steps: 2 STEPS  Entrance Stairs-Rails: None Home Layout: Two level Alternate Level Stairs-Number of Steps: 14 STEPS   Bathroom Shower/Tub: Chief Strategy Officer: Standard     Home Equipment: None   Additional Comments: PATIENT STATES THAT FAMILY WORKS      Prior Functioning/Environment          Comments: UNKNOWN. PNT STATES I BUT HE DOES NOT SEEM TO BE AN ACCURATE HISTORIAN.         OT Problem List: Decreased strength;Decreased cognition;Decreased safety awareness      OT Treatment/Interventions: Self-care/ADL training;DME and/or AE instruction;Therapeutic activities;Patient/family education    OT Goals(Current goals can be found in the care plan section) Acute Rehab OT Goals Patient Stated Goal: GO HOME OT Goal Formulation: With patient Time For Goal Achievement:  05/26/18 Potential to Achieve Goals: Good  OT Frequency: Min 2X/week   Barriers to D/C: Decreased caregiver support  PATIENT STATES FAMILY WORKS       Co-evaluation              AM-PAC PT "6 Clicks" Daily Activity     Outcome Measure Help from another person eating meals?: A Little Help from another person taking care of personal grooming?: A Little Help from another person toileting, which includes using toliet, bedpan, or urinal?: A Little Help from another person bathing (including washing, rinsing, drying)?: A Little Help from another person to put on and taking off regular upper body clothing?: A Little Help from another person to put on and taking off regular lower body clothing?: A Little 6 Click Score: 18   End of Session Equipment Utilized During Treatment: Gait belt Nurse Communication: Mobility status(PLACEMENT OF CHAIR ALARM AND DISCUSSED VISON IMPAIRMENT)  Activity Tolerance: Patient tolerated treatment well Patient left: in chair;with call bell/phone within reach;with chair alarm set  OT Visit Diagnosis: Unsteadiness on feet (R26.81);Low vision, both eyes (H54.2)                Time: 1610-9604 OT Time Calculation (min): 70 min Charges:  OT General Charges $OT Visit: 1 Visit OT Evaluation $OT Eval Moderate Complexity: 1 Mod OT Treatments $Self Care/Home Management : 23-37 mins $Therapeutic Activity: 8-22 mins G-Codes:     6 CLICKS.  Rhianna Raulerson 05/12/2018, 10:20 AM

## 2018-05-12 NOTE — Progress Notes (Signed)
Admitted to 3W10 via ED stretcher.  Talkative, cooperative, having some difficulty with word finding, right arm drift & difficulty walking withy right leg.  Denies pain.  Oriented to safety precautions, meds, plan of care & bed alarm. Verbalized understanding.  Stroke education initiated.

## 2018-05-12 NOTE — Progress Notes (Addendum)
Hospitalist Linton Flemings text paged that wife thinks pt is more confused. Pt has been confuseded during the day and on/off with me.   BP=181/93

## 2018-05-13 ENCOUNTER — Inpatient Hospital Stay (HOSPITAL_COMMUNITY): Payer: BLUE CROSS/BLUE SHIELD

## 2018-05-13 ENCOUNTER — Other Ambulatory Visit: Payer: Self-pay | Admitting: Cardiology

## 2018-05-13 DIAGNOSIS — E1139 Type 2 diabetes mellitus with other diabetic ophthalmic complication: Secondary | ICD-10-CM

## 2018-05-13 DIAGNOSIS — E785 Hyperlipidemia, unspecified: Secondary | ICD-10-CM

## 2018-05-13 DIAGNOSIS — I6330 Cerebral infarction due to thrombosis of unspecified cerebral artery: Secondary | ICD-10-CM

## 2018-05-13 DIAGNOSIS — I639 Cerebral infarction, unspecified: Secondary | ICD-10-CM

## 2018-05-13 LAB — GLUCOSE, CAPILLARY
Glucose-Capillary: 183 mg/dL — ABNORMAL HIGH (ref 65–99)
Glucose-Capillary: 161 mg/dL — ABNORMAL HIGH (ref 65–99)

## 2018-05-13 MED ORDER — NICOTINE 7 MG/24HR TD PT24: 7.0000 mg | MEDICATED_PATCH | Freq: Every day | TRANSDERMAL | 0 refills | Status: AC

## 2018-05-13 MED ORDER — ASPIRIN 325 MG PO TBEC: 325.0000 mg | DELAYED_RELEASE_TABLET | Freq: Every day | ORAL | 0 refills | Status: AC

## 2018-05-13 MED ORDER — ENALAPRIL MALEATE 10 MG PO TABS: 10.0000 mg | ORAL_TABLET | Freq: Every day | ORAL | Status: AC

## 2018-05-13 MED ORDER — METFORMIN HCL 850 MG PO TABS: 850.0000 mg | ORAL_TABLET | Freq: Two times a day (BID) | ORAL | Status: AC

## 2018-05-13 MED ORDER — CLOPIDOGREL BISULFATE 75 MG PO TABS: 75.0000 mg | ORAL_TABLET | Freq: Every day | ORAL | 0 refills | Status: AC

## 2018-05-13 NOTE — Progress Notes (Signed)
Inpatient Diabetes Program Recommendations  AACE/ADA: New Consensus Statement on Inpatient Glycemic Control (2015)  Target Ranges:  Prepandial:   less than 140 mg/dL      Peak postprandial:   less than 180 mg/dL (1-2 hours)      Critically ill patients:  140 - 180 mg/dL   Lab Results  Component Value Date   GLUCAP 161 (H) 05/13/2018   HGBA1C 9.2 (H) 05/12/2018     Spoke with patient and wife regarding diabetes management. Verified patient was taking Metformin 850 mg BID, Amaryl 4 mg in AM. Reviewed patient's current A1c of 9.2%. Explained what a A1c is and what it measures. Also reviewed goal A1c with patient, importance of good glucose control @ home, and blood sugar goals. We discussed the impact of poor glycemic control.  Patient has a meter and strips, but is not interested in checking BS. He was given supplies by PCP and plans to follow up with him. Is not interested in endocrinology.  Briefly educated patient on benefits of checking BS and we discussed the [employer]. Patient states, " I will think about it." Reviewed the benefits, cost, and how this may impact his lifestyle at home.  At this time has no further questions regarding diabetes.   Thanks, Lujean Rave, MSN, RNC-OB Diabetes Coordinator [phone] (8a-5p)

## 2018-05-13 NOTE — Progress Notes (Signed)
EEG completed; results pending.    

## 2018-05-13 NOTE — Procedures (Signed)
ELECTROENCEPHALOGRAM REPORT  Date of Study: 05/13/2018  Patient's Name: Bradley Hall MRN: [id number] Date of Birth: Nov 29, 1966  Referring Provider: Ritta Slot, MD  Clinical History: 52 year old male with stroke presents with word-finding difficulty  Medications: Aspirin Lipitor HCTZ Hydralazine Insulin  Technical Summary: A multichannel digital EEG recording measured by the international 10-20 system with electrodes applied with paste and impedances below 5000 ohms performed in our laboratory with EKG monitoring in an awake patient.  Hyperventilation and photic stimulation were not performed.  The digital EEG was referentially recorded, reformatted, and digitally filtered in a variety of bipolar and referential montages for optimal display.    Description: The patient is awake during the recording.  There is excessive muscle artifact.  During maximal wakefulness, there is a symmetric, medium voltage 9 Hz posterior dominant rhythm that attenuates with eye opening.  The record is symmetric.  Stage 2 sleep is not seen.  There were no epileptiform discharges or electrographic seizures seen.    EKG lead was unremarkable.  Impression: This awake EEG is normal.    Clinical Correlation: A normal EEG does not exclude a clinical diagnosis of epilepsy.  If further clinical questions remain, prolonged EEG may be helpful.  Clinical correlation is advised.   Shon Millet, DO

## 2018-05-13 NOTE — Progress Notes (Signed)
Pt being discharged from hospital per orders from MD. Pt and spouse educated on discharge instructions. Pt and spouse verbalized understanding of instructions. All questions and concerns were addressed. Pt's IV was removed prior to discharge. Pt exited hospital via wheelchair accompanied by staff. 

## 2018-05-13 NOTE — Evaluation (Signed)
Speech Language Pathology Evaluation Patient Details Name: Bradley Hall MRN: [id number] DOB: 17-Mar-1966 Today's Date: 05/13/2018 Time: 0102-7253 SLP Time Calculation (min) (ACUTE ONLY): 15 min  Problem List:  Patient Active Problem List   Diagnosis Date Noted  . Acute CVA (cerebrovascular accident) (HCC) 05/12/2018  . Noncompliance   . Blindness and low vision   . Cerebral infarction (HCC) 11/02/2011  . HTN (hypertension) 11/02/2011  . HLD (hyperlipidemia) 11/02/2011  . Diabetes mellitus (HCC) 11/02/2011  . Alcohol abuse 11/02/2011   Past Medical History:  Past Medical History:  Diagnosis Date  . Diabetes mellitus   . Hyperlipidemia   . Hypertension   . Stroke [facility])    Past Surgical History:  Past Surgical History:  Procedure Laterality Date  . NO PAST SURGERIES     HPI:  Bradley Hall a 52 y.o.malewith medical history significant ofprevious CVA, diabetes, hypertension and previous alcohol abuse history who was brought in from home with aphasia. Patient started having speech problems with no significant focal weakness. Speech is slowly improving. Patient is still confused little but he is gaining his mental capacity. He was altered when he came in. Bradley Hall Wo Contrast (neuro Protocol) IMPRESSION: 1. Patchy small volume acute ischemic nonhemorrhagic infarcts involving the right splenium and parasagittal right occipital lobe. 2. Atrophy with advanced chronic small vessel ischemic disease with multiple remote lacunar infarctsinvolving the bilateral basal ganglia/corona radiata, thalami, and pons and per chart review pt's left eye total blind, right eye decreased visual acuity to FC, visual field full.   Assessment / Plan / Recommendation Clinical Impression  Pt presents with worsening cognitive deficits than baseline. Pt current presents with impaired sustained attention, no awareness of deficits, decreased topic maintanence, inability to comprehend basic information,  basic problem solving that are further complicated by visual deficits, impulsivity and poor task toleration. Pt's wife present and endorses new deficits. She or their son will be present to provide 24 hour supervision as pt is high risk d/t decreased safety awareness. Recommend [facility] to follow for therapy within home but pt refuses. Wife states that he won't allow anyone to come into the home for therapy. ST to sign off for acute services. All education provided as well as information provided on stroke prevention.     SLP Assessment  SLP Recommendation/Assessment: All further Speech Lanaguage Pathology  needs can be addressed in the next venue of care SLP Visit Diagnosis: Cognitive communication deficit (R41.841)    Follow Up Recommendations  Home health SLP    Frequency and Duration           SLP Evaluation Cognition  Overall Cognitive Status: Impaired/Different from baseline Arousal/Alertness: Awake/alert Orientation Level: Oriented X4 Attention: Sustained Sustained Attention: Impaired Sustained Attention Impairment: Verbal basic;Functional basic Memory: Impaired Memory Impairment: Storage deficit;Decreased recall of new information Awareness: Impaired Awareness Impairment: Intellectual impairment;Emergent impairment;Anticipatory impairment Problem Solving: Impaired Problem Solving Impairment: Verbal basic;Functional basic Behaviors: Restless;Impulsive;Perseveration;Poor frustration tolerance;Confabulation Safety/Judgment: Impaired       Comprehension  Auditory Comprehension Overall Auditory Comprehension: Appears within functional limits for tasks assessed(d/t cognitive deficits) Visual Recognition/Discrimination Discrimination: Not tested Reading Comprehension Reading Status: Not tested    Expression Expression Primary Mode of Expression: Verbal Verbal Expression Overall Verbal Expression: Appears within functional limits for tasks assessed(d/t cognitive  deficits) Pragmatics: Impairment Impairments: Topic appropriateness;Topic maintenance;Turn Taking(d/t cognitive deficits) Non-Verbal Means of Communication: Not applicable Written Expression Dominant Hand: Right Written Expression: Not tested   Oral / Motor  Oral Motor/Sensory Function Overall Oral Motor/Sensory Function: Within  functional limits Motor Speech Overall Motor Speech: Appears within functional limits for tasks assessed   GO                    Bradley Hall 05/13/2018, 1:12 PM

## 2018-05-13 NOTE — Discharge Summary (Signed)
Physician Discharge Summary  Bradley Hall ZOX:[id number] DOB: 31-Jan-1966 DOA: 05/11/2018  PCP: Ralene Ok, MD  Admit date: 05/11/2018 Discharge date: 05/13/2018  Admitted From: Home Disposition: Home  Recommendations for Outpatient Follow-up:  1. Follow up with PCP in 1 week 2. Follow up with neurology in 4 weeks 3. Needs home event monitor 4. Please obtain BMP/CBC in one week 5. Please follow up on the following pending results: None  Home Health: PT, OT Equipment/Devices: None  Discharge Condition: Stable CODE STATUS: Full code Diet recommendation: Heart healthy/carb modified   Brief/Interim Summary:  Admission HPI written by Rometta Emery, MD   Chief Complaint: Aphasia  HPI: Bradley Hall is a 52 y.o. male with medical history significant of previous CVA, diabetes, hypertension and previous alcohol abuse history who was brought in from home with aphasia. Patient started having speech problems with no significant focal weakness.  Speech is slowly improving. Patient is still confused little but he is gaining his mental capacity. He was altered when he came in.  ED Course: patient has a temperature 98.5 blood pressure 142/86 pulse 101 at rest rate of 20. His oxygen status 100% room air. Normal CBCs but sodium 132 chloride is 98 and CO2 of 19. Sodium was 128 on arrival and head CT without contrast was negative. MRI of the brain confirmed patchy acute ischemic nonhemorrhagic infarcts in the right splenium and parasagittal right occipital lobe    Hospital course:  Acute CVA Embolic. History of stroke. MRI significant for multiple small nonhemorrhagic infarcts. CTA head and neck significant for mild-moderate areas of intracranial stenosis. Aspirin increased to 325 mg and Plavix added. Continue aspirin and Plavix for three months, then discontinue aspirin. Neurology recommending 30 day event monitor (cardiology will coordinate outpatient follow-up) in addition to  outpatient neurology follow-up. LDL of 95 and hemoglobin A1C of 9.2%.  Diabetes Hemoglobin A1C of 9.2%. Unsure of compliance. Would be beneficial to start insulin, but may be difficult with patient's vision issues. Outpatient management.  Essential hypertension Permissive hypertension. Resume home regimen on 5/22.  Hyperlipidemia LDL of 95. Continued Lipitor  Tobacco abuse Cessation discussed.  Grade 1 diastolic dysfunction Noted on Transthoracic Echocardiogram.  Discharge Diagnoses:  Principal Problem:   Cerebral infarction (HCC) Active Problems:   HTN (hypertension)   HLD (hyperlipidemia)   Diabetes mellitus (HCC)   Acute CVA (cerebrovascular accident) (HCC)   Noncompliance   Blindness and low vision    Discharge Instructions  Discharge Instructions    Ambulatory referral to Neurology   Complete by:  As directed    Follow up with stroke clinic NP (Jessica Vanschaick or Darrol Angel, if both not available, consider Manson Allan, or Ahern) at [facility] in about 4 weeks. Thanks.     Allergies as of 05/13/2018   No Known Allergies     Medication List    STOP taking these medications   atorvastatin 80 MG tablet Commonly known as:  LIPITOR   hydrochlorothiazide 25 MG tablet Commonly known as:  HYDRODIURIL     TAKE these medications   aspirin 325 MG EC tablet Take 1 tablet (325 mg total) by mouth daily. Take for three months What changed:    medication strength  how much to take  additional instructions   clopidogrel 75 MG tablet Commonly known as:  PLAVIX Take 1 tablet (75 mg total) by mouth daily.   enalapril 10 MG tablet Commonly known as:  VASOTEC Take 1 tablet (10 mg total) by mouth daily. Start  taking on:  05/15/2018 What changed:  These instructions start on 05/15/2018. If you are unsure what to do until then, ask your doctor or other care provider.   glimepiride 4 MG tablet Commonly known as:  AMARYL Take 4 mg by mouth daily with breakfast.     metFORMIN 850 MG tablet Commonly known as:  GLUCOPHAGE Take 1 tablet (850 mg total) by mouth 2 (two) times daily with a meal. Start taking on:  05/14/2018   nicotine 7 mg/24hr patch Commonly known as:  NICODERM CQ - dosed in mg/24 hr Place 1 patch (7 mg total) onto the skin daily.   rosuvastatin 20 MG tablet Commonly known as:  CRESTOR Take 20 mg by mouth daily.      Follow-up Information    [facility]. Schedule an appointment as soon as possible for a visit in 4 week(s).   Specialty:  Radiology Contact information: [address] [place] [address] [phone]       Ralene Ok, MD. Schedule an appointment as soon as possible for a visit in 1 week(s).   Specialty:  Internal Medicine Contact information: [address] [place] [address] [phone]        [facility]. Call.   Why:  Event monitor Contact information: [address] [place] [address] [phone]         No Known Allergies  Consultations:  Neurology   Procedures/Studies: Ct Angio Head W Or Wo Contrast  Result Date: 05/12/2018 CLINICAL DATA:  Initial evaluation for waxing and waning stroke symptoms. Evaluate for cerebral vaso spasm. EXAM: CT ANGIOGRAPHY HEAD AND NECK CT PERFUSION BRAIN TECHNIQUE: Multidetector CT imaging of the head and neck was performed using the standard protocol during bolus administration of intravenous contrast. Multiplanar CT image reconstructions and MIPs were obtained to evaluate the vascular anatomy. Carotid stenosis measurements (when applicable) are obtained utilizing NASCET criteria, using the distal internal carotid diameter as the denominator. Multiphase CT imaging of the brain was performed following IV bolus contrast injection. Subsequent parametric perfusion maps were calculated using RAPID software. CONTRAST:   ISOVUE-370 IOPAMIDOL (ISOVUE-370) INJECTION 76% COMPARISON:  Prior CT and MRI from earlier the same day. FINDINGS: CTA NECK FINDINGS Aortic arch: Aortic arch of normal caliber with normal 3 vessel morphology. Atheromatous plaque about the aortic arch without hemodynamically significant stenosis at the origin of the great vessels. Concentric mixed plaque about the proximal left subclavian artery with short-segment stenosis of approximately 40% by NASCET criteria. Subclavian arteries otherwise widely patent. Right carotid system: Right common and internal carotid arteries widely patent without stenosis, dissection, or occlusion. Mild a centric plaque about the right bifurcation without significant stenosis. Left carotid system: Mild scattered atheromatous irregularity within the left common carotid artery without flow-limiting stenosis. Left ICA widely patent to the skull base without stenosis, dissection, or occlusion. No atheromatous narrowing about the left carotid bifurcation. Vertebral arteries: Both of the vertebral arteries arise from the subclavian arteries. Focal plaque at the origin of the right vertebral artery with mild stenosis. Right vertebral artery dominant. Vertebral arteries otherwise widely patent within the neck without stenosis, dissection, or occlusion. Skeleton: No acute osseous abnormality. No worrisome lytic or blastic osseous lesions. Patient is edentulous. Other neck: No acute soft tissue abnormality within the neck. Scattered calcified tonsilliths noted. No adenopathy. Salivary glands within normal limits. Thyroid normal. Upper chest: Visualized upper chest and mediastinum within normal limits. Visualized lungs are  clear. Review of the MIP images confirms the above findings CTA HEAD FINDINGS Anterior circulation: Petrous segments widely patent bilaterally. Mild atheromatous plaque within the cavernous/supraclinoid ICAs, right worse than left. No significant stenosis on the left. Mild to  moderate multifocal narrowing on the right. ICA termini widely patent. A1 segments patent bilaterally. Right A1 hypoplastic, accounting for the slightly diminutive right ICA is compared to the left. Normal anterior communicating artery. Anterior cerebral arteries widely patent to their distal aspects without stenosis. M1 segments patent without stenosis. Normal MCA bifurcations. No proximal M2 occlusion. Distal MCA branches well perfused and symmetric. No significant vascularity laryngitis a to suggest vaso spasm. Posterior circulation: Vertebral arteries widely patent to the vertebrobasilar junction. Right vertebral artery dominant. Posterior inferior cerebral arteries not visualized. Basilar artery widely patent to its distal aspect without stenosis. Superior cerebral arteries patent bilaterally. PCAs primarily supplied via the basilar and are well perfused to their distal aspects without stenosis. Small bilateral posterior communicating arteries noted. No significant vascular irregularity to suggest vaso spasm. Venous sinuses: Patent. Anatomic variants: None significant. No aneurysm or other vascular abnormality. Delayed phase: No abnormal enhancement. Review of the MIP images confirms the above findings CT Brain Perfusion Findings: CBF (<30%) Volume: 0mL Perfusion (Tmax>6.0s) volume: 0mL Mismatch Volume: 0mL Infarction Location:No CT perfusion for acute ischemia. IMPRESSION: 1. Negative CTA of the head and neck with no evidence for large vessel occlusion. 2. No acute ischemia or other perfusion abnormality evident by CT perfusion. 3. Atheromatous plaque at the right carotid siphon with resultant mild to moderate multifocal stenosis. 4. Short-segment atheromatous stenosis of approximately 40% at the proximal left subclavian artery. 5. Otherwise negative CTA of the head and neck with relatively minor atherosclerotic change for patient age. No significant vascular irregularity to suggest vaso spasm, vasculitis, or  other vascular abnormality. Electronically Signed   By: Rise Mu M.D.   On: 05/12/2018 01:24   Dg Chest 2 View  Result Date: 05/11/2018 CLINICAL DATA:  Altered mental status.  History of stroke. EXAM: CHEST - 2 VIEW COMPARISON:  Chest radiograph October 02, 2013 FINDINGS: Cardiomediastinal silhouette is normal. No pleural effusions or focal consolidations. Trachea projects midline and there is no pneumothorax. Soft tissue planes and included osseous structures are non-suspicious. IMPRESSION: Negative. Electronically Signed   By: Awilda Metro M.D.   On: 05/11/2018 17:36   Ct Head Wo Contrast  Result Date: 05/11/2018 CLINICAL DATA:  Altered mental status EXAM: CT HEAD WITHOUT CONTRAST TECHNIQUE: Contiguous axial images were obtained from the base of the skull through the vertex without intravenous contrast. COMPARISON:  MRI 10/03/2013 FINDINGS: Brain: There is atrophy and chronic small vessel disease changes. Multiple old bilateral basal ganglia and thalamic lacunar infarcts. No acute intracranial abnormality. Specifically, no hemorrhage, hydrocephalus, mass lesion, acute infarction, or significant intracranial injury. Vascular: No hyperdense vessel or unexpected calcification. Skull: No acute calvarial abnormality. Sinuses/Orbits: Visualized paranasal sinuses and mastoids clear. Orbital soft tissues unremarkable. Other: None IMPRESSION: No acute intracranial abnormality. Atrophy, chronic microvascular disease. Old bilateral lacunar infarcts as above. Electronically Signed   By: Charlett Nose M.D.   On: 05/11/2018 17:07   Ct Angio Neck W Or Wo Contrast  Result Date: 05/12/2018 CLINICAL DATA:  Initial evaluation for waxing and waning stroke symptoms. Evaluate for cerebral vaso spasm. EXAM: CT ANGIOGRAPHY HEAD AND NECK CT PERFUSION BRAIN TECHNIQUE: Multidetector CT imaging of the head and neck was performed using the standard protocol during bolus administration of intravenous contrast.  Multiplanar CT image reconstructions  and MIPs were obtained to evaluate the vascular anatomy. Carotid stenosis measurements (when applicable) are obtained utilizing NASCET criteria, using the distal internal carotid diameter as the denominator. Multiphase CT imaging of the brain was performed following IV bolus contrast injection. Subsequent parametric perfusion maps were calculated using RAPID software. CONTRAST:  ISOVUE-370 IOPAMIDOL (ISOVUE-370) INJECTION 76% COMPARISON:  Prior CT and MRI from earlier the same day. FINDINGS: CTA NECK FINDINGS Aortic arch: Aortic arch of normal caliber with normal 3 vessel morphology. Atheromatous plaque about the aortic arch without hemodynamically significant stenosis at the origin of the great vessels. Concentric mixed plaque about the proximal left subclavian artery with short-segment stenosis of approximately 40% by NASCET criteria. Subclavian arteries otherwise widely patent. Right carotid system: Right common and internal carotid arteries widely patent without stenosis, dissection, or occlusion. Mild a centric plaque about the right bifurcation without significant stenosis. Left carotid system: Mild scattered atheromatous irregularity within the left common carotid artery without flow-limiting stenosis. Left ICA widely patent to the skull base without stenosis, dissection, or occlusion. No atheromatous narrowing about the left carotid bifurcation. Vertebral arteries: Both of the vertebral arteries arise from the subclavian arteries. Focal plaque at the origin of the right vertebral artery with mild stenosis. Right vertebral artery dominant. Vertebral arteries otherwise widely patent within the neck without stenosis, dissection, or occlusion. Skeleton: No acute osseous abnormality. No worrisome lytic or blastic osseous lesions. Patient is edentulous. Other neck: No acute soft tissue abnormality within the neck. Scattered calcified tonsilliths noted. No adenopathy.  Salivary glands within normal limits. Thyroid normal. Upper chest: Visualized upper chest and mediastinum within normal limits. Visualized lungs are clear. Review of the MIP images confirms the above findings CTA HEAD FINDINGS Anterior circulation: Petrous segments widely patent bilaterally. Mild atheromatous plaque within the cavernous/supraclinoid ICAs, right worse than left. No significant stenosis on the left. Mild to moderate multifocal narrowing on the right. ICA termini widely patent. A1 segments patent bilaterally. Right A1 hypoplastic, accounting for the slightly diminutive right ICA is compared to the left. Normal anterior communicating artery. Anterior cerebral arteries widely patent to their distal aspects without stenosis. M1 segments patent without stenosis. Normal MCA bifurcations. No proximal M2 occlusion. Distal MCA branches well perfused and symmetric. No significant vascularity laryngitis a to suggest vaso spasm. Posterior circulation: Vertebral arteries widely patent to the vertebrobasilar junction. Right vertebral artery dominant. Posterior inferior cerebral arteries not visualized. Basilar artery widely patent to its distal aspect without stenosis. Superior cerebral arteries patent bilaterally. PCAs primarily supplied via the basilar and are well perfused to their distal aspects without stenosis. Small bilateral posterior communicating arteries noted. No significant vascular irregularity to suggest vaso spasm. Venous sinuses: Patent. Anatomic variants: None significant. No aneurysm or other vascular abnormality. Delayed phase: No abnormal enhancement. Review of the MIP images confirms the above findings CT Brain Perfusion Findings: CBF (<30%) Volume: 0mL Perfusion (Tmax>6.0s) volume: 0mL Mismatch Volume: 0mL Infarction Location:No CT perfusion for acute ischemia. IMPRESSION: 1. Negative CTA of the head and neck with no evidence for large vessel occlusion. 2. No acute ischemia or other perfusion  abnormality evident by CT perfusion. 3. Atheromatous plaque at the right carotid siphon with resultant mild to moderate multifocal stenosis. 4. Short-segment atheromatous stenosis of approximately 40% at the proximal left subclavian artery. 5. Otherwise negative CTA of the head and neck with relatively minor atherosclerotic change for patient age. No significant vascular irregularity to suggest vaso spasm, vasculitis, or other vascular abnormality. Electronically Signed   By:  Rise Mu M.D.   On: 05/12/2018 01:24   Mr Brain Wo Contrast (neuro Protocol)  Result Date: 05/11/2018 CLINICAL DATA:  Initial evaluation for acute confusion, aphasia. EXAM: MRI HEAD WITHOUT CONTRAST TECHNIQUE: Multiplanar, multiecho pulse sequences of the brain and surrounding structures were obtained without intravenous contrast. COMPARISON:  Prior CT from earlier the same day as well as previous MRI from 10/03/2013 FINDINGS: Brain: Study somewhat limited as the patient was unable to tolerate the full length of the exam, and the studies provided are mildly degraded by motion artifact. Diffuse prominence of the CSF containing spaces compatible with generalized cerebral atrophy. Patchy and confluent T2/FLAIR hyperintensity within the periventricular and deep white matter both cerebral hemispheres most compatible with chronic small vessel ischemic change, advanced for age. Chronic microvascular ischemic changes present within the pons. Multiple superimposed remote lacunar infarcts present within the bilateral basal ganglia/corona radiata, thalami, pons, and left middle cerebellar peduncle. There is patchy small volume restricted diffusion within the right splenium and parasagittal right occipital lobe (series 5001, image 63, 61, 59), consistent with acute ischemic infarct, right PCA territory. No associated hemorrhage or mass effect. No other evidence for acute or subacute ischemia. Gray-white matter differentiation otherwise  maintained. No acute or chronic intracranial hemorrhage. No mass lesion, midline shift or mass effect. No hydrocephalus. No extra-axial fluid collection. Major dural sinuses are grossly patent. Pituitary gland suprasellar region normal. Midline structures intact and normal. Vascular: Major intracranial vascular flow voids are maintained. Skull and upper cervical spine: Craniocervical junction within normal limits. Visualized upper cervical spine demonstrates no acute finding. Bone marrow signal intensity normal. No scalp soft tissue abnormality. Sinuses/Orbits: Globes and orbital soft tissues within normal limits. Mild scattered mucosal thickening within the ethmoidal air cells. Paranasal sinuses are otherwise clear. No mastoid effusion. Inner ear structures normal. Other: None. IMPRESSION: 1. Patchy small volume acute ischemic nonhemorrhagic infarcts involving the right splenium and parasagittal right occipital lobe. 2. Atrophy with advanced chronic small vessel ischemic disease with multiple remote lacunar infarcts involving the bilateral basal ganglia/corona radiata, thalami, and pons. Electronically Signed   By: Rise Mu M.D.   On: 05/11/2018 21:36   Ct Cerebral Perfusion W Contrast  Result Date: 05/12/2018 CLINICAL DATA:  Initial evaluation for waxing and waning stroke symptoms. Evaluate for cerebral vaso spasm. EXAM: CT ANGIOGRAPHY HEAD AND NECK CT PERFUSION BRAIN TECHNIQUE: Multidetector CT imaging of the head and neck was performed using the standard protocol during bolus administration of intravenous contrast. Multiplanar CT image reconstructions and MIPs were obtained to evaluate the vascular anatomy. Carotid stenosis measurements (when applicable) are obtained utilizing NASCET criteria, using the distal internal carotid diameter as the denominator. Multiphase CT imaging of the brain was performed following IV bolus contrast injection. Subsequent parametric perfusion maps were calculated  using RAPID software. CONTRAST:  ISOVUE-370 IOPAMIDOL (ISOVUE-370) INJECTION 76% COMPARISON:  Prior CT and MRI from earlier the same day. FINDINGS: CTA NECK FINDINGS Aortic arch: Aortic arch of normal caliber with normal 3 vessel morphology. Atheromatous plaque about the aortic arch without hemodynamically significant stenosis at the origin of the great vessels. Concentric mixed plaque about the proximal left subclavian artery with short-segment stenosis of approximately 40% by NASCET criteria. Subclavian arteries otherwise widely patent. Right carotid system: Right common and internal carotid arteries widely patent without stenosis, dissection, or occlusion. Mild a centric plaque about the right bifurcation without significant stenosis. Left carotid system: Mild scattered atheromatous irregularity within the left common carotid artery without flow-limiting stenosis. Left  ICA widely patent to the skull base without stenosis, dissection, or occlusion. No atheromatous narrowing about the left carotid bifurcation. Vertebral arteries: Both of the vertebral arteries arise from the subclavian arteries. Focal plaque at the origin of the right vertebral artery with mild stenosis. Right vertebral artery dominant. Vertebral arteries otherwise widely patent within the neck without stenosis, dissection, or occlusion. Skeleton: No acute osseous abnormality. No worrisome lytic or blastic osseous lesions. Patient is edentulous. Other neck: No acute soft tissue abnormality within the neck. Scattered calcified tonsilliths noted. No adenopathy. Salivary glands within normal limits. Thyroid normal. Upper chest: Visualized upper chest and mediastinum within normal limits. Visualized lungs are clear. Review of the MIP images confirms the above findings CTA HEAD FINDINGS Anterior circulation: Petrous segments widely patent bilaterally. Mild atheromatous plaque within the cavernous/supraclinoid ICAs, right worse than left. No  significant stenosis on the left. Mild to moderate multifocal narrowing on the right. ICA termini widely patent. A1 segments patent bilaterally. Right A1 hypoplastic, accounting for the slightly diminutive right ICA is compared to the left. Normal anterior communicating artery. Anterior cerebral arteries widely patent to their distal aspects without stenosis. M1 segments patent without stenosis. Normal MCA bifurcations. No proximal M2 occlusion. Distal MCA branches well perfused and symmetric. No significant vascularity laryngitis a to suggest vaso spasm. Posterior circulation: Vertebral arteries widely patent to the vertebrobasilar junction. Right vertebral artery dominant. Posterior inferior cerebral arteries not visualized. Basilar artery widely patent to its distal aspect without stenosis. Superior cerebral arteries patent bilaterally. PCAs primarily supplied via the basilar and are well perfused to their distal aspects without stenosis. Small bilateral posterior communicating arteries noted. No significant vascular irregularity to suggest vaso spasm. Venous sinuses: Patent. Anatomic variants: None significant. No aneurysm or other vascular abnormality. Delayed phase: No abnormal enhancement. Review of the MIP images confirms the above findings CT Brain Perfusion Findings: CBF (<30%) Volume: 0mL Perfusion (Tmax>6.0s) volume: 0mL Mismatch Volume: 0mL Infarction Location:No CT perfusion for acute ischemia. IMPRESSION: 1. Negative CTA of the head and neck with no evidence for large vessel occlusion. 2. No acute ischemia or other perfusion abnormality evident by CT perfusion. 3. Atheromatous plaque at the right carotid siphon with resultant mild to moderate multifocal stenosis. 4. Short-segment atheromatous stenosis of approximately 40% at the proximal left subclavian artery. 5. Otherwise negative CTA of the head and neck with relatively minor atherosclerotic change for patient age. No significant vascular  irregularity to suggest vaso spasm, vasculitis, or other vascular abnormality. Electronically Signed   By: Rise Mu M.D.   On: 05/12/2018 01:24     Transthoracic Echocardiogram (05/13/18)  Study Conclusions  - Left ventricle: The cavity size was normal. Systolic function was   normal. The estimated ejection fraction was in the range of 60%   to 65%. Wall motion was normal; there were no regional wall   motion abnormalities. There was an increased relative   contribution of atrial contraction to ventricular filling.   Doppler parameters are consistent with abnormal left ventricular   relaxation (grade 1 diastolic dysfunction). - Aortic valve: Trileaflet; normal thickness, mildly calcified   leaflets. - Pulmonary arteries: Systolic pressure could not be accurately   estimated. - Inferior vena cava: The vessel was dilated. The respirophasic   diameter changes were blunted (< 50%), consistent with elevated   central venous pressure.  Subjective: No issues today. No weakness.  Discharge Exam: Vitals:   05/13/18 0422 05/13/18 0817  BP: (!) 159/72 (!) 152/89  Pulse:  86  Resp: 20 20  Temp:  98.4 F (36.9 C)  SpO2:  100%   Vitals:   05/12/18 2359 05/13/18 0356 05/13/18 0422 05/13/18 0817  BP: (!) 148/77 (!) 171/90 (!) 159/72 (!) 152/89  Pulse: 79 75  86  Resp:  (!) Temp:  98.3 F (36.8 C)  98.4 F (36.9 C)  TempSrc:  Oral  Oral  SpO2:  100%  100%  Height:        General: Pt is alert, awake, not in acute distress Cardiovascular: RRR, S1/S2 +, no rubs, no gallops Respiratory: CTA bilaterally, no wheezing, no rhonchi Abdominal: Soft, NT, ND, bowel sounds + Extremities: no edema, no cyanosis Neuro: no weakness. Decreased visual acuity.    The results of significant diagnostics from this hospitalization (including imaging, microbiology, ancillary and laboratory) are listed below for reference.     Microbiology: No results found for this or any  previous visit (from the past 240 hour(s)).   Labs: BNP (last 3 results) No results for input(s): BNP in the last 8760 hours. Basic Metabolic Panel: Recent Labs  Lab 05/11/18 1631 05/11/18 1637 05/12/18 0508  NA 128* 132* 135  K 3.9 4.0 3.9  CL 98* 98* 103  CO2 19*  --  23  GLUCOSE 72 69 97  BUN CREATININE 1.30* 1.10 1.13  CALCIUM 9.4  --  9.5   Liver Function Tests: Recent Labs  Lab 05/11/18 1631 05/12/18 0508  AST 22 17  ALT 18 16*  ALKPHOS 66 61  BILITOT 0.6 0.5  PROT 7.1 6.4*  ALBUMIN 4.2 3.8   No results for input(s): LIPASE, AMYLASE in the last 168 hours. No results for input(s): AMMONIA in the last 168 hours. CBC: Recent Labs  Lab 05/11/18 1631 05/11/18 1637 05/12/18 0508  WBC 9.0  --  6.6  NEUTROABS 6.0  --   --   HGB 13.9 14.6 13.5  HCT 40.8 43.0 39.0  MCV 93.8  --  93.3  PLT 326  --  318   Cardiac Enzymes: No results for input(s): CKTOTAL, CKMB, CKMBINDEX, TROPONINI in the last 168 hours. BNP: Invalid input(s): POCBNP CBG: Recent Labs  Lab 05/12/18 0200 05/12/18 0601 05/12/18 1120 05/12/18 1634 05/13/18 0815  GLUCAP 160* 97 150* 129* 183*   D-Dimer No results for input(s): DDIMER in the last 72 hours. Hgb A1c Recent Labs    05/12/18 0508  HGBA1C 9.2*   Lipid Profile Recent Labs    05/12/18 0508  CHOL 166  HDL 36*  LDLCALC 95  TRIG 409*  CHOLHDL 4.6   Thyroid function studies No results for input(s): TSH, T4TOTAL, T3FREE, THYROIDAB in the last 72 hours.  Invalid input(s): FREET3 Anemia work up No results for input(s): VITAMINB12, FOLATE, FERRITIN, TIBC, IRON, RETICCTPCT in the last 72 hours. Urinalysis    Component Value Date/Time   COLORURINE YELLOW 05/11/2018 2040   APPEARANCEUR CLEAR 05/11/2018 2040   LABSPEC 1.011 05/11/2018 2040   PHURINE 5.0 05/11/2018 2040   GLUCOSEU NEGATIVE 05/11/2018 2040   HGBUR NEGATIVE 05/11/2018 2040   BILIRUBINUR NEGATIVE 05/11/2018 2040   KETONESUR 5 (A) 05/11/2018 2040     PROTEINUR NEGATIVE 05/11/2018 2040   UROBILINOGEN 0.2 10/02/2013 1949   NITRITE NEGATIVE 05/11/2018 2040   LEUKOCYTESUR TRACE (A) 05/11/2018 2040     SIGNED:   Jacquelin Hawking, MD [facility] 05/13/2018, 10:58 AM

## 2018-05-13 NOTE — Discharge Instructions (Signed)
Bradley Hall,  You admitted because of an acute stroke.  You will go home with prescriptions for aspirin and Plavix.  He will be on aspirin for a total of 3 months only.  He will continue Plavix indefinitely.  The neurologist recommends that you have an event monitor to watch your heart rhythm while you are an outpatient.  The cardiologist office was consulted and they will get in touch with you for follow-up.

## 2018-05-13 NOTE — Care Management Note (Signed)
Case Management Note  Patient Details  Name: Bradley Hall MRN: [id number] Date of Birth: 1966/10/28  Subjective/Objective:        Pt admitted with CVA. He is from home with spouse.             Action/Plan: Pt with orders for [facility] services. CM provided choice and they selected [facility]. Lupita Leash with [facility] notified and accepted the referral.  Pts wife states their son is home and can provide 24 hour supervision.  Wife to provide transportation home.   Expected Discharge Date:  05/13/18               Expected Discharge Plan:  Home w Home Health Services  In-House Referral:     Discharge planning Services  CM Consult  Post Acute Care Choice:  Home Health Choice offered to:  Patient, Spouse  DME Arranged:    DME Agency:     HH Arranged:  PT, OT HH Agency:  [facility]  Status of Service:  Completed, signed off  If discussed at Long Length of Stay Meetings, dates discussed:    Additional Comments:  Kermit Balo, RN 05/13/2018, 12:04 PM

## 2018-05-14 LAB — HIV ANTIBODY (ROUTINE TESTING W REFLEX): HIV SCREEN 4TH GENERATION: NONREACTIVE

## 2018-05-15 ENCOUNTER — Telehealth: Payer: Self-pay | Admitting: Cardiology

## 2018-05-15 NOTE — Telephone Encounter (Signed)
New message    Called patient to schedule event monitor - he does not wants to have it done, he does not have insurance and can not afford to have it done at this time. Offered to send out application for monitor patient refused.

## 2018-05-16 NOTE — Telephone Encounter (Signed)
Please send to Neurologist Dr. Roda Shutters.  Let pt know this is to help decide what caused his stroke.Marland Kitchen

## 2018-05-17 NOTE — Telephone Encounter (Signed)
Sent to Dr. Marvel Plan, Neurologist, per Nada Boozer, NP.

## 2018-05-17 NOTE — Telephone Encounter (Signed)
Noted. Thank you for letting me know. We will discuss with him at next follow up visit.  Marvel Plan, MD PhD Stroke Neurology 05/17/2018 12:13 PM

## 2018-05-21 ENCOUNTER — Other Ambulatory Visit: Payer: Self-pay

## 2018-05-21 NOTE — Patient Outreach (Signed)
[facility]) Care Management  05/21/2018  Bradley Hall [birth year] [id number]     EMMI-STROKE RED ON EMMI ALERT Day # 6 Date: 05/20/18 Red Alert Reason: " Feeling worse overall? Yes"   Outreach attempt # 1 to patient. Spoke with patient. Patient very guarded and reluctant to verify HIPAA. He was rushing and kept trying to end the call. RN CM attempted to explain purpose of call and patient voiced that he did not want those calls anymore and did not wish to discuss red alert or speak further with RN CM.       Plan: RN CM will close case at this time. RN CM will notify [facility] administrative assistant to deactivate automated EMMI calls.    Antionette Fairy, RN,BSN,CCM [facility] Care Management Telephonic Care Management Coordinator Direct Phone: [phone] Toll Free: [phone] Fax: [phone]

## 2018-05-23 NOTE — Progress Notes (Signed)
Diabetes, uncontrolled, hyperglycemia

## 2018-05-30 ENCOUNTER — Ambulatory Visit: Payer: BLUE CROSS/BLUE SHIELD

## 2020-10-06 ENCOUNTER — Emergency Department (HOSPITAL_COMMUNITY): Payer: BLUE CROSS/BLUE SHIELD

## 2020-10-06 ENCOUNTER — Other Ambulatory Visit: Payer: Self-pay

## 2020-10-06 ENCOUNTER — Encounter (HOSPITAL_COMMUNITY): Payer: Self-pay | Admitting: Emergency Medicine

## 2020-10-06 ENCOUNTER — Inpatient Hospital Stay (HOSPITAL_COMMUNITY)
Admission: EM | Admit: 2020-10-06 | Discharge: 2020-10-08 | DRG: 811 | Disposition: A | Discharge status: Home / Self Care | Payer: BLUE CROSS/BLUE SHIELD | Attending: Internal Medicine | Admitting: Internal Medicine

## 2020-10-06 DIAGNOSIS — H547 Unspecified visual loss: Secondary | ICD-10-CM | POA: Diagnosis present

## 2020-10-06 DIAGNOSIS — Z79899 Other long term (current) drug therapy: Secondary | ICD-10-CM | POA: Diagnosis not present

## 2020-10-06 DIAGNOSIS — Z20822 Contact with and (suspected) exposure to covid-19: Secondary | ICD-10-CM | POA: Diagnosis present

## 2020-10-06 DIAGNOSIS — E872 Acidosis: Secondary | ICD-10-CM | POA: Diagnosis present

## 2020-10-06 DIAGNOSIS — I2510 Atherosclerotic heart disease of native coronary artery without angina pectoris: Secondary | ICD-10-CM | POA: Diagnosis present

## 2020-10-06 DIAGNOSIS — F1010 Alcohol abuse, uncomplicated: Secondary | ICD-10-CM | POA: Diagnosis present

## 2020-10-06 DIAGNOSIS — N19 Unspecified kidney failure: Secondary | ICD-10-CM | POA: Diagnosis not present

## 2020-10-06 DIAGNOSIS — R579 Shock, unspecified: Secondary | ICD-10-CM

## 2020-10-06 DIAGNOSIS — D62 Acute posthemorrhagic anemia: Secondary | ICD-10-CM | POA: Diagnosis not present

## 2020-10-06 DIAGNOSIS — Z7902 Long term (current) use of antithrombotics/antiplatelets: Secondary | ICD-10-CM

## 2020-10-06 DIAGNOSIS — E1165 Type 2 diabetes mellitus with hyperglycemia: Secondary | ICD-10-CM | POA: Diagnosis present

## 2020-10-06 DIAGNOSIS — E11649 Type 2 diabetes mellitus with hypoglycemia without coma: Secondary | ICD-10-CM | POA: Diagnosis present

## 2020-10-06 DIAGNOSIS — N170 Acute kidney failure with tubular necrosis: Secondary | ICD-10-CM | POA: Diagnosis present

## 2020-10-06 DIAGNOSIS — I10 Essential (primary) hypertension: Secondary | ICD-10-CM | POA: Diagnosis present

## 2020-10-06 DIAGNOSIS — S0101XA Laceration without foreign body of scalp, initial encounter: Secondary | ICD-10-CM | POA: Diagnosis present

## 2020-10-06 DIAGNOSIS — J690 Pneumonitis due to inhalation of food and vomit: Secondary | ICD-10-CM | POA: Diagnosis present

## 2020-10-06 DIAGNOSIS — E875 Hyperkalemia: Secondary | ICD-10-CM

## 2020-10-06 DIAGNOSIS — R578 Other shock: Secondary | ICD-10-CM | POA: Diagnosis present

## 2020-10-06 DIAGNOSIS — S0990XA Unspecified injury of head, initial encounter: Secondary | ICD-10-CM

## 2020-10-06 DIAGNOSIS — W010XXA Fall on same level from slipping, tripping and stumbling without subsequent striking against object, initial encounter: Secondary | ICD-10-CM | POA: Diagnosis present

## 2020-10-06 DIAGNOSIS — Z8673 Personal history of transient ischemic attack (TIA), and cerebral infarction without residual deficits: Secondary | ICD-10-CM | POA: Diagnosis not present

## 2020-10-06 DIAGNOSIS — W19XXXA Unspecified fall, initial encounter: Secondary | ICD-10-CM

## 2020-10-06 DIAGNOSIS — Y92012 Bathroom of single-family (private) house as the place of occurrence of the external cause: Secondary | ICD-10-CM

## 2020-10-06 DIAGNOSIS — Z7984 Long term (current) use of oral hypoglycemic drugs: Secondary | ICD-10-CM | POA: Diagnosis not present

## 2020-10-06 DIAGNOSIS — E11319 Type 2 diabetes mellitus with unspecified diabetic retinopathy without macular edema: Secondary | ICD-10-CM | POA: Diagnosis present

## 2020-10-06 LAB — URINALYSIS, ROUTINE W REFLEX MICROSCOPIC
Bacteria, UA: NONE SEEN
Bilirubin Urine: NEGATIVE
Glucose, UA: 150 mg/dL — AB
Ketones, ur: 5 mg/dL — AB
Nitrite: NEGATIVE
Protein, ur: NEGATIVE mg/dL
Specific Gravity, Urine: 1.029 (ref 1.005–1.030)
pH: 5 (ref 5.0–8.0)

## 2020-10-06 LAB — BASIC METABOLIC PANEL
Anion gap: 14 (ref 5–15)
Anion gap: 11 (ref 5–15)
BUN: 25 mg/dL — ABNORMAL HIGH (ref 6–20)
BUN: 20 mg/dL (ref 6–20)
CO2: 20 mmol/L — ABNORMAL LOW (ref 22–32)
CO2: 27 mmol/L (ref 22–32)
Calcium: 7.9 mg/dL — ABNORMAL LOW (ref 8.9–10.3)
Calcium: 8.2 mg/dL — ABNORMAL LOW (ref 8.9–10.3)
Chloride: 101 mmol/L (ref 98–111)
Chloride: 99 mmol/L (ref 98–111)
Creatinine, Ser: 2.41 mg/dL — ABNORMAL HIGH (ref 0.61–1.24)
Creatinine, Ser: 2.03 mg/dL — ABNORMAL HIGH (ref 0.61–1.24)
GFR, Estimated: 29 mL/min — ABNORMAL LOW (ref >60)
GFR, Estimated: 36 mL/min — ABNORMAL LOW (ref >60)
Glucose, Bld: 189 mg/dL — ABNORMAL HIGH (ref 70–99)
Glucose, Bld: 141 mg/dL — ABNORMAL HIGH (ref 70–99)
Potassium: 4.3 mmol/L (ref 3.5–5.1)
Potassium: 4 mmol/L (ref 3.5–5.1)
Sodium: 135 mmol/L (ref 135–145)
Sodium: 137 mmol/L (ref 135–145)

## 2020-10-06 LAB — GLUCOSE, CAPILLARY
Glucose-Capillary: 236 mg/dL — ABNORMAL HIGH (ref 70–99)
Glucose-Capillary: 103 mg/dL — ABNORMAL HIGH (ref 70–99)
Glucose-Capillary: 176 mg/dL — ABNORMAL HIGH (ref 70–99)

## 2020-10-06 LAB — CBC
HCT: 27.2 % — ABNORMAL LOW (ref 39.0–52.0)
HCT: 28.6 % — ABNORMAL LOW (ref 39.0–52.0)
Hemoglobin: 9 g/dL — ABNORMAL LOW (ref 13.0–17.0)
Hemoglobin: 10.1 g/dL — ABNORMAL LOW (ref 13.0–17.0)
MCH: 31 pg (ref 26.0–34.0)
MCH: 31.1 pg (ref 26.0–34.0)
MCHC: 33.1 g/dL (ref 30.0–36.0)
MCHC: 35.3 g/dL (ref 30.0–36.0)
MCV: 93.8 fL (ref 80.0–100.0)
MCV: 88 fL (ref 80.0–100.0)
Platelets: 255 10*3/uL (ref 150–400)
Platelets: 164 10*3/uL (ref 150–400)
RBC: 2.9 MIL/uL — ABNORMAL LOW (ref 4.22–5.81)
RBC: 3.25 MIL/uL — ABNORMAL LOW (ref 4.22–5.81)
RDW: 13.2 % (ref 11.5–15.5)
RDW: 13.2 % (ref 11.5–15.5)
WBC: 21.5 10*3/uL — ABNORMAL HIGH (ref 4.0–10.5)
WBC: 13.6 10*3/uL — ABNORMAL HIGH (ref 4.0–10.5)
nRBC: 0 % (ref 0.0–0.2)
nRBC: 0 % (ref 0.0–0.2)

## 2020-10-06 LAB — RESPIRATORY PANEL BY RT PCR (FLU A&B, COVID)
Influenza A by PCR: NEGATIVE
Influenza B by PCR: NEGATIVE
SARS Coronavirus 2 by RT PCR: NEGATIVE

## 2020-10-06 LAB — COMPREHENSIVE METABOLIC PANEL WITH GFR
ALT: 43 U/L (ref 0–44)
AST: 45 U/L — ABNORMAL HIGH (ref 15–41)
Albumin: 3.9 g/dL (ref 3.5–5.0)
Alkaline Phosphatase: 62 U/L (ref 38–126)
Anion gap: 26 — ABNORMAL HIGH (ref 5–15)
BUN: 32 mg/dL — ABNORMAL HIGH (ref 6–20)
CO2: 13 mmol/L — ABNORMAL LOW (ref 22–32)
Calcium: 8.6 mg/dL — ABNORMAL LOW (ref 8.9–10.3)
Chloride: 90 mmol/L — ABNORMAL LOW (ref 98–111)
Creatinine, Ser: 4.18 mg/dL — ABNORMAL HIGH (ref 0.61–1.24)
GFR, Estimated: 15 mL/min — ABNORMAL LOW
Glucose, Bld: 434 mg/dL — ABNORMAL HIGH (ref 70–99)
Potassium: 7.1 mmol/L (ref 3.5–5.1)
Sodium: 129 mmol/L — ABNORMAL LOW (ref 135–145)
Total Bilirubin: 0.8 mg/dL (ref 0.3–1.2)
Total Protein: 6.5 g/dL (ref 6.5–8.1)

## 2020-10-06 LAB — RAPID URINE DRUG SCREEN, HOSP PERFORMED
Amphetamines: NOT DETECTED
Barbiturates: NOT DETECTED
Benzodiazepines: NOT DETECTED
Cocaine: NOT DETECTED
Opiates: NOT DETECTED
Tetrahydrocannabinol: NOT DETECTED

## 2020-10-06 LAB — HIV ANTIBODY (ROUTINE TESTING W REFLEX): HIV Screen 4th Generation wRfx: NONREACTIVE

## 2020-10-06 LAB — HEMOGLOBIN AND HEMATOCRIT, BLOOD
HCT: 21.8 % — ABNORMAL LOW (ref 39.0–52.0)
Hemoglobin: 7.6 g/dL — ABNORMAL LOW (ref 13.0–17.0)

## 2020-10-06 LAB — LACTIC ACID, PLASMA
Lactic Acid, Venous: 7.8 mmol/L (ref 0.5–1.9)
Lactic Acid, Venous: 3.4 mmol/L (ref 0.5–1.9)

## 2020-10-06 LAB — ABO/RH: ABO/RH(D): O POS

## 2020-10-06 LAB — CK: Total CK: 277 U/L (ref 49–397)

## 2020-10-06 LAB — MRSA PCR SCREENING: MRSA by PCR: NEGATIVE

## 2020-10-06 LAB — PREPARE RBC (CROSSMATCH)

## 2020-10-06 LAB — PROTIME-INR
INR: 1.2 (ref 0.8–1.2)
Prothrombin Time: 14.8 seconds (ref 11.4–15.2)

## 2020-10-06 LAB — ETHANOL: Alcohol, Ethyl (B): <10 mg/dL (ref <10)

## 2020-10-06 MED ORDER — DOCUSATE SODIUM 100 MG PO CAPS: 100.0000 mg | ORAL_CAPSULE | Freq: Two times a day (BID) | ORAL | Status: DC | PRN

## 2020-10-06 MED ORDER — LACTATED RINGERS IV BOLUS
1000.0000 mL | Freq: Once | INTRAVENOUS | Status: AC
  Administered 2020-10-06: 1000 mL via INTRAVENOUS

## 2020-10-06 MED ORDER — ONDANSETRON HCL 4 MG/2ML IJ SOLN: 4.0000 mg | Freq: Four times a day (QID) | INTRAMUSCULAR | Status: DC | PRN

## 2020-10-06 MED ORDER — INSULIN ASPART 100 UNIT/ML ~~LOC~~ SOLN: 0.0000 [IU] | SUBCUTANEOUS | Status: DC

## 2020-10-06 MED ORDER — PANTOPRAZOLE SODIUM 40 MG IV SOLR
40.0000 mg | INTRAVENOUS | Status: DC
  Administered 2020-10-07 – 2020-10-08 (×2): 40 mg via INTRAVENOUS
  Filled 2020-10-06 (×2): qty 40

## 2020-10-06 MED ORDER — SODIUM CHLORIDE 0.9 % IV SOLN: 8.0000 mg | Freq: Once | INTRAVENOUS | Status: DC

## 2020-10-06 MED ORDER — THIAMINE HCL 100 MG/ML IJ SOLN
100.0000 mg | Freq: Once | INTRAMUSCULAR | Status: DC
  Filled 2020-10-06: qty 2

## 2020-10-06 MED ORDER — THIAMINE HCL 100 MG PO TABS
100.0000 mg | ORAL_TABLET | Freq: Every day | ORAL | Status: DC
  Administered 2020-10-07 – 2020-10-08 (×2): 100 mg via ORAL
  Filled 2020-10-06 (×2): qty 1

## 2020-10-06 MED ORDER — SODIUM CHLORIDE 0.9% IV SOLUTION: Freq: Once | INTRAVENOUS | Status: DC

## 2020-10-06 MED ORDER — STERILE WATER FOR INJECTION IV SOLN
INTRAVENOUS | Status: DC
  Filled 2020-10-06 (×3): qty 850

## 2020-10-06 MED ORDER — SODIUM CHLORIDE 0.9 % IV BOLUS
1000.0000 mL | Freq: Once | INTRAVENOUS | Status: AC
  Administered 2020-10-06: 1000 mL via INTRAVENOUS

## 2020-10-06 MED ORDER — POLYETHYLENE GLYCOL 3350 17 G PO PACK: 17.0000 g | PACK | Freq: Every day | ORAL | Status: DC | PRN

## 2020-10-06 MED ORDER — INSULIN ASPART 100 UNIT/ML ~~LOC~~ SOLN
0.0000 [IU] | SUBCUTANEOUS | Status: DC
  Administered 2020-10-06: 7 [IU] via SUBCUTANEOUS
  Administered 2020-10-07 (×2): 4 [IU] via SUBCUTANEOUS

## 2020-10-06 MED ORDER — ASPIRIN 81 MG PO CHEW
81.0000 mg | CHEWABLE_TABLET | Freq: Every day | ORAL | Status: DC
  Administered 2020-10-07 – 2020-10-08 (×2): 81 mg via ORAL
  Filled 2020-10-06 (×2): qty 1

## 2020-10-06 MED ORDER — SODIUM BICARBONATE 8.4 % IV SOLN
100.0000 meq | Freq: Once | INTRAVENOUS | Status: AC
  Administered 2020-10-06: 100 meq via INTRAVENOUS
  Filled 2020-10-06: qty 50

## 2020-10-06 MED ORDER — CHLORHEXIDINE GLUCONATE CLOTH 2 % EX PADS
6.0000 | MEDICATED_PAD | Freq: Every day | CUTANEOUS | Status: DC
  Administered 2020-10-06 – 2020-10-08 (×2): 6 via TOPICAL

## 2020-10-06 MED ORDER — LORAZEPAM 1 MG PO TABS: 1.0000 mg | ORAL_TABLET | Freq: Four times a day (QID) | ORAL | Status: DC | PRN

## 2020-10-06 MED ORDER — INSULIN DETEMIR 100 UNIT/ML ~~LOC~~ SOLN: 12.0000 [IU] | Freq: Two times a day (BID) | SUBCUTANEOUS | Status: DC

## 2020-10-06 MED ORDER — INFLUENZA VAC SPLIT QUAD 0.5 ML IM SUSY: 0.5000 mL | PREFILLED_SYRINGE | INTRAMUSCULAR | Status: DC

## 2020-10-06 MED ORDER — SODIUM CHLORIDE 0.9 % IV SOLN
500.0000 mg | INTRAVENOUS | Status: AC
  Administered 2020-10-07 – 2020-10-08 (×2): 500 mg via INTRAVENOUS
  Filled 2020-10-06 (×2): qty 500

## 2020-10-06 MED ORDER — SODIUM ZIRCONIUM CYCLOSILICATE 10 G PO PACK: 10.0000 g | PACK | Freq: Three times a day (TID) | ORAL | Status: DC

## 2020-10-06 MED ORDER — INSULIN DETEMIR 100 UNIT/ML ~~LOC~~ SOLN
12.0000 [IU] | Freq: Two times a day (BID) | SUBCUTANEOUS | Status: DC
  Administered 2020-10-06 (×2): 12 [IU] via SUBCUTANEOUS
  Administered 2020-10-07: 10 [IU] via SUBCUTANEOUS
  Filled 2020-10-06 (×6): qty 0.12

## 2020-10-06 MED ORDER — PNEUMOCOCCAL VAC POLYVALENT 25 MCG/0.5ML IJ INJ: 0.5000 mL | INJECTION | INTRAMUSCULAR | Status: DC

## 2020-10-06 MED ORDER — IOHEXOL 300 MG/ML  SOLN
100.0000 mL | Freq: Once | INTRAMUSCULAR | Status: AC | PRN
  Administered 2020-10-06: 100 mL via INTRAVENOUS

## 2020-10-06 MED ORDER — ACETAMINOPHEN 325 MG PO TABS
650.0000 mg | ORAL_TABLET | Freq: Four times a day (QID) | ORAL | Status: DC | PRN
  Administered 2020-10-06 – 2020-10-07 (×2): 650 mg via ORAL
  Filled 2020-10-06 (×2): qty 2

## 2020-10-06 MED ORDER — INSULIN ASPART 100 UNIT/ML IV SOLN
10.0000 [IU] | Freq: Once | INTRAVENOUS | Status: AC
  Administered 2020-10-06: 10 [IU] via INTRAVENOUS

## 2020-10-06 MED ORDER — SODIUM CHLORIDE 0.9 % IV SOLN
2.0000 g | Freq: Once | INTRAVENOUS | Status: AC
  Administered 2020-10-06: 2 g via INTRAVENOUS
  Filled 2020-10-06: qty 20

## 2020-10-06 MED ORDER — SODIUM CHLORIDE 0.9 % IV SOLN
2.0000 g | INTRAVENOUS | Status: AC
  Administered 2020-10-07 – 2020-10-08 (×2): 2 g via INTRAVENOUS
  Filled 2020-10-06 (×2): qty 2

## 2020-10-06 MED ORDER — SODIUM CHLORIDE 0.9 % IV SOLN
500.0000 mg | Freq: Once | INTRAVENOUS | Status: AC
  Administered 2020-10-06: 500 mg via INTRAVENOUS
  Filled 2020-10-06: qty 500

## 2020-10-06 MED ORDER — CALCIUM GLUCONATE-NACL 1-0.675 GM/50ML-% IV SOLN
1.0000 g | Freq: Once | INTRAVENOUS | Status: AC
  Administered 2020-10-06: 1000 mg via INTRAVENOUS
  Filled 2020-10-06: qty 50

## 2020-10-06 MED ORDER — ONDANSETRON HCL 4 MG/2ML IJ SOLN
INTRAMUSCULAR | Status: AC
  Administered 2020-10-06: 8 mg via INTRAVENOUS
  Filled 2020-10-06: qty 4

## 2020-10-06 MED ORDER — ONDANSETRON HCL 4 MG/2ML IJ SOLN: 8.0000 mg | Freq: Once | INTRAMUSCULAR | Status: AC

## 2020-10-06 MED ORDER — ADULT MULTIVITAMIN W/MINERALS CH
1.0000 | ORAL_TABLET | Freq: Every day | ORAL | Status: DC
  Administered 2020-10-07 – 2020-10-08 (×2): 1 via ORAL
  Filled 2020-10-06 (×2): qty 1

## 2020-10-06 NOTE — ED Notes (Signed)
Large amount of dried blood removed from pts head and face, laceration to top of head about 5 cm in length. Bleeding has stopped and is controlled. Dr. Jacqulyn Bath aware

## 2020-10-06 NOTE — ED Notes (Signed)
Pt complaining of neck pain, Dr. Clayborne Dana at bedside aware. C-collar placed

## 2020-10-06 NOTE — ED Notes (Signed)
Total IV input in ED, 3,730 ml. Flowsheet and trauma narrator not communicating correctly to accurately reflect this.

## 2020-10-06 NOTE — ED Notes (Signed)
Dr Long at bedside

## 2020-10-06 NOTE — ED Triage Notes (Addendum)
Pt presents to ED BIB [facility]. Pt c/o mechanical fall aprox 24h ago, refused to go to hospital. Per EMS pt altered after fall and found to be hypotensive - 70/50

## 2020-10-06 NOTE — Consult Note (Signed)
TRAUMA H&P  10/06/2020, 10:26 AM   Chief Complaint: Level 1 trauma activation for fall on blood thinners, hypotension  The patient is an 54 y.o. male.   HPI: 41M reports GLF in bathroom 10/05/2020 and hit his head. Denies LOC. Lives with wife. Reportedly found in bed covered in blood and brought to the ED. Currently denies pain.   History reviewed. No pertinent past medical history.  No pertinent family history.  Social History:  has no history on file for tobacco use, alcohol use, and drug use.    Allergies: No Known Allergies  Medications: reviewed  Results for orders placed or performed during the hospital encounter of 10/06/20 (from the past 48 hour(s))  Comprehensive metabolic panel     Status: Abnormal   Collection Time: 10/06/20  5:56 AM  Result Value Ref Range   Sodium 129 (L) 135 - 145 mmol/L   Potassium 7.1 (HH) 3.5 - 5.1 mmol/L    Comment: NO VISIBLE HEMOLYSIS CRITICAL RESULT CALLED TO, READ BACK BY AND VERIFIED WITH: FLORES M,RN 10/06/20 0646 WAYK    Chloride 90 (L) 98 - 111 mmol/L   CO2 13 (L) 22 - 32 mmol/L   Glucose, Bld 434 (H) 70 - 99 mg/dL    Comment: Glucose reference range applies only to samples taken after fasting for at least 8 hours.   BUN 32 (H) 6 - 20 mg/dL   Creatinine, Ser 3.53 (H) 0.61 - 1.24 mg/dL   Calcium 8.6 (L) 8.9 - 10.3 mg/dL   Total Protein 6.5 6.5 - 8.1 g/dL   Albumin 3.9 3.5 - 5.0 g/dL   AST 45 (H) 15 - 41 U/L   ALT 43 0 - 44 U/L   Alkaline Phosphatase 62 38 - 126 U/L   Total Bilirubin 0.8 0.3 - 1.2 mg/dL   GFR, Estimated 15 (L) >60 mL/min   Anion gap 26 (H) 5 - 15    Comment: Performed at [facility], [address], [place], [place] [address]  CBC     Status: Abnormal   Collection Time: 10/06/20  5:56 AM  Result Value Ref Range   WBC 21.5 (H) 4.0 - 10.5 K/uL   RBC 2.90 (L) 4.22 - 5.81 MIL/uL   Hemoglobin 9.0 (L) 13.0 - 17.0 g/dL   HCT 15.4 (L) 39 - 52 %   MCV 93.8 80.0 - 100.0 fL   MCH 31.0 26.0 - 34.0 pg    MCHC 33.1 30.0 - 36.0 g/dL   RDW 00.8 67.6 - 19.5 %   Platelets 255 150 - 400 K/uL   nRBC 0.0 0.0 - 0.2 %    Comment: Performed at [facility], [address], [place], [place] [address]  Ethanol     Status: None   Collection Time: 10/06/20  5:56 AM  Result Value Ref Range   Alcohol, Ethyl (B) <10 <10 mg/dL    Comment: (NOTE) Lowest detectable limit for serum alcohol is 10 mg/dL.  For medical purposes only. Performed at [facility], [address], [place], [place] [address]   Protime-INR     Status: None   Collection Time: 10/06/20  5:56 AM  Result Value Ref Range   Prothrombin Time 14.8 11.4 - 15.2 seconds   INR 1.2 0.8 - 1.2    Comment: (NOTE) INR goal varies based on device and disease states. Performed at [facility], [address], [place], [place] [address]   CK  Status: None   Collection Time: 10/06/20  5:56 AM  Result Value Ref Range   Total CK 277 49.0 - 397.0 U/L    Comment: Performed at [facility], [address], [place], [place] [address]  Type and screen [facility]     Status: None (Preliminary result)   Collection Time: 10/06/20  5:56 AM  Result Value Ref Range   ABO/RH(D) O POS    Antibody Screen NEG    Sample Expiration 10/09/2020,2359    Unit Number [id number]    Blood Component Type RED CELLS,LR    Unit division 00    Status of Unit ISSUED    Transfusion Status OK TO TRANSFUSE    Crossmatch Result      Compatible Performed at [facility], [address] [place], [place] [address]   Respiratory Panel by RT PCR (Flu A&B, Covid) - Nasopharyngeal Swab     Status: None   Collection Time: 10/06/20  6:23 AM   Specimen: Nasopharyngeal Swab  Result Value Ref Range   SARS Coronavirus 2 by RT PCR NEGATIVE NEGATIVE    Comment: (NOTE) SARS-CoV-2 target nucleic acids are NOT DETECTED.  The SARS-CoV-2 RNA is generally detectable in upper respiratoy specimens during the acute phase of infection.  The lowest concentration of SARS-CoV-2 viral copies this assay can detect is 131 copies/mL. A negative result does not preclude SARS-Cov-2 infection and should not be used as the sole basis for treatment or other patient management decisions. A negative result may occur with  improper specimen collection/handling, submission of specimen other than nasopharyngeal swab, presence of viral mutation(s) within the areas targeted by this assay, and inadequate number of viral copies (<131 copies/mL). A negative result must be combined with clinical observations, patient history, and epidemiological information. The expected result is Negative.  Fact Sheet for Patients:  https://www.moore.com/  Fact Sheet for Healthcare Providers:  https://www.young.biz/  This test is no t yet approved or cleared by the [place] FDA and  has been authorized for detection and/or diagnosis of SARS-CoV-2 by FDA under an Emergency Use Authorization (EUA). This EUA will remain  in effect (meaning this test can be used) for the duration of the COVID-19 declaration under Section 564(b)(1) of the Act, 21 U.S.C. section 360bbb-3(b)(1), unless the authorization is terminated or revoked sooner.     Influenza A by PCR NEGATIVE NEGATIVE   Influenza B by PCR NEGATIVE NEGATIVE    Comment: (NOTE) The Xpert Xpress SARS-CoV-2/FLU/RSV assay is intended as an aid in  the diagnosis of influenza from Nasopharyngeal swab specimens and  should not be used as a sole basis for treatment. Nasal washings and  aspirates are unacceptable for Xpert Xpress SARS-CoV-2/FLU/RSV  testing.  Fact Sheet for Patients: https://www.moore.com/  Fact Sheet for Healthcare Providers: https://www.young.biz/  This test is not yet approved or cleared by the [place] FDA and  has been authorized for detection and/or diagnosis of SARS-CoV-2 by  FDA under an  Emergency Use Authorization (EUA). This EUA will remain  in effect (meaning this test can be used) for the duration of the  Covid-19 declaration under Section 564(b)(1) of the Act, 21  U.S.C. section 360bbb-3(b)(1), unless the authorization is  terminated or revoked. Performed at [facility], [address], [place], [place] [address]   Prepare RBC (crossmatch)     Status: None   Collection Time: 10/06/20  7:00 AM  Result Value Ref Range   Order Confirmation  ORDER PROCESSED BY BLOOD BANK Performed at [facility], [address], [place], [place] [address]   ABO/Rh     Status: None   Collection Time: 10/06/20  7:24 AM  Result Value Ref Range   ABO/RH(D)      O POS Performed at [facility], [address], [place], [place] [address]   Lactic acid, plasma     Status: Abnormal   Collection Time: 10/06/20  7:45 AM  Result Value Ref Range   Lactic Acid, Venous 7.8 (HH) 0.5 - 1.9 mmol/L    Comment: CRITICAL RESULT CALLED TO, READ BACK BY AND VERIFIED WITH: BISHOP,L RN @ [phone] 10/06/20 LEONARD,A Performed at [facility], [address], [place], [place] [address]     CT Head Wo Contrast  Result Date: 10/06/2020 CLINICAL DATA:  Head trauma. Abnormal mental status. Fall 24 hours ago. EXAM: CT HEAD WITHOUT CONTRAST TECHNIQUE: Contiguous axial images were obtained from the base of the skull through the vertex without intravenous contrast. COMPARISON:  CT head without contrast 05/11/2018 FINDINGS: Brain: Moderately advanced atrophy and white matter disease bilaterally is stable. Remote lacunar infarcts are again noted in the basal ganglia, stable. Stable remote lacunar infarcts are present in the thalami bilaterally. No acute infarct, hemorrhage, or mass lesion is present. The ventricles are proportionate to the degree of atrophy. No significant extraaxial fluid collection is present. Vascular: Atherosclerotic calcifications are again noted within the cavernous  internal carotid arteries bilaterally. No hyperdense vessel is present. Skull: Soft tissue swelling/hematoma is noted near the vertex. No underlying fracture is present. Sinuses/Orbits: Anterior right paranasal sinus disease is present. Fluid is present in the right maxillary sinus and inferior right frontal sinus. Mucosal thickening is present in the anterior ethmoid air cells, right greater than left. Minimal mucosal thickening is present in the left sphenoid sinus. The mastoid air cells are clear. Right lens replacement is noted since the prior exam. Focal soft tissue density is again noted in the posterior left globe. Question chronic retinal detachment. IMPRESSION: 1. Soft tissue swelling/hematoma near the vertex without underlying fracture. 2. No acute intracranial abnormality or significant interval change. 3. Stable moderately advanced atrophy and white matter disease. 4. Stable remote lacunar infarcts of the basal ganglia and thalami bilaterally. 5. Interval right lens replacement. 6. Stable focal soft tissue density in the posterior left globe. Question chronic retinal detachment. Electronically Signed   By: Marin Roberts M.D.   On: 10/06/2020 06:20   CT Chest W Contrast  Result Date: 10/06/2020 CLINICAL DATA:  Chest trauma. Moderate to severe. Fall. Trauma to head. Patient is hypotensive. EXAM: CT CHEST, ABDOMEN, AND PELVIS WITH CONTRAST TECHNIQUE: Multidetector CT imaging of the chest, abdomen and pelvis was performed following the standard protocol during bolus administration of intravenous contrast. CONTRAST:  OMNIPAQUE IOHEXOL 300 MG/ML  SOLN COMPARISON:  TWO-VIEW CHEST X-RAY 05/11/2018. FINDINGS: CT CHEST FINDINGS Cardiovascular: Heart size is normal. Coronary artery calcifications are present. Atherosclerotic changes are noted at the aortic arch and great vessel origins without focal stenosis or change. No aneurysm is present. Pulmonary arteries are within normal limits.  Mediastinum/Nodes: No enlarged mediastinal, hilar, or axillary lymph nodes. Thyroid gland, trachea, and esophagus demonstrate no significant findings. Lungs/Pleura: Patchy ill-defined airspace opacities are present in the right upper lobe and to a lesser extent in the superior segment of the lower lobes bilaterally. No significant consolidation is present. No focal contusion or pneumothorax is present. No significant pleural effusion is  present. Musculoskeletal: Remote bilateral rib fractures are again noted. Vertebral body heights are maintained. Superior endplate Schmorl's node present at T10 on the left. No acute fractures are present. Chronic degenerative changes are noted at the manubriosternal joint with some associated sclerosis. CT ABDOMEN PELVIS FINDINGS Hepatobiliary: Diffuse fatty infiltration of the liver is noted. No discrete lesion is present. No contusion is present. The common bile duct and gallbladder are normal. Pancreas: Unremarkable. No pancreatic ductal dilatation or surrounding inflammatory changes. Spleen: No splenic injury or perisplenic hematoma. Adrenals/Urinary Tract: No adrenal hemorrhage or renal injury identified. Bladder is unremarkable. Cortical thinning is noted posteriorly in the right kidney. Calcifications at the lower pole of the right kidney measure up to 7 mm, but are nonobstructive. The ureters are within normal limits bilaterally. The urinary bladder is within normal limits. Stomach/Bowel: Stomach is within normal limits. Appendix appears normal. No evidence of bowel wall thickening, distention, or inflammatory changes. Vascular/Lymphatic: Vascular calcifications are noted at the aorta and branch vessels without aneurysm. No significant adenopathy is present. Reproductive: Prostate is unremarkable. Other: No abdominal wall hernia or abnormality. No abdominopelvic ascites. Musculoskeletal: Vertebral body heights are maintained. Degenerative changes are noted at the SI joints  bilaterally with fusion on the right. Bony pelvis is otherwise unremarkable. The hips are located and within normal limits bilaterally. IMPRESSION: 1. No acute trauma to the chest, abdomen, or pelvis. 2. Patchy ill-defined airspace opacities in the right upper lobe and to a lesser extent in the superior segment of the lower lobes bilaterally are concerning for infection. 3. Remote bilateral rib fractures. 4. Chronic degenerative changes at the manubriosternal joint without acute fracture. 5. Hepatic steatosis. 6. Nonobstructing stones at the lower pole of the right kidney. 7. Aortic Atherosclerosis (ICD10-I70.0). These results were called by telephone at the time of interpretation on 10/06/2020 at 6:28am to provider CORNETT, who verbally acknowledged these results. Electronically Signed   By: Marin Roberts M.D.   On: 10/06/2020 06:34   CT Cervical Spine Wo Contrast  Result Date: 10/06/2020 CLINICAL DATA:  Chest trauma.  Trauma to head.  Fall.  Head pain. EXAM: CT CERVICAL SPINE WITHOUT CONTRAST TECHNIQUE: Multidetector CT imaging of the cervical spine was performed without intravenous contrast. Multiplanar CT image reconstructions were also generated. COMPARISON:  CT a of the neck 05/12/2018 FINDINGS: Alignment: No significant listhesis is present. Skull base and vertebrae: Craniocervical junction is normal. Mild degenerative changes C1-2 are stable. Vertebral body heights are normal. No acute or healing fracture is present. Soft tissues and spinal canal: No prevertebral fluid or swelling. No visible canal hematoma. Disc levels: Minimal degenerative changes are present, most evident at C2-3. No significant change is present. Mandible is located. Visualized portions are intact. The patient is edentulous. Upper chest: Lung apices are clear. Thoracic inlet is within normal limits. IMPRESSION: 1. No acute fracture or traumatic subluxation. 2. Minimal degenerative changes are stable. Electronically Signed    By: Marin Roberts M.D.   On: 10/06/2020 06:25   CT ABDOMEN PELVIS W CONTRAST  Result Date: 10/06/2020 CLINICAL DATA:  Chest trauma. Moderate to severe. Fall. Trauma to head. Patient is hypotensive. EXAM: CT CHEST, ABDOMEN, AND PELVIS WITH CONTRAST TECHNIQUE: Multidetector CT imaging of the chest, abdomen and pelvis was performed following the standard protocol during bolus administration of intravenous contrast. CONTRAST:  OMNIPAQUE IOHEXOL 300 MG/ML  SOLN COMPARISON:  TWO-VIEW CHEST X-RAY 05/11/2018. FINDINGS: CT CHEST FINDINGS Cardiovascular: Heart size is normal. Coronary artery calcifications are present. Atherosclerotic changes are noted  at the aortic arch and great vessel origins without focal stenosis or change. No aneurysm is present. Pulmonary arteries are within normal limits. Mediastinum/Nodes: No enlarged mediastinal, hilar, or axillary lymph nodes. Thyroid gland, trachea, and esophagus demonstrate no significant findings. Lungs/Pleura: Patchy ill-defined airspace opacities are present in the right upper lobe and to a lesser extent in the superior segment of the lower lobes bilaterally. No significant consolidation is present. No focal contusion or pneumothorax is present. No significant pleural effusion is present. Musculoskeletal: Remote bilateral rib fractures are again noted. Vertebral body heights are maintained. Superior endplate Schmorl's node present at T10 on the left. No acute fractures are present. Chronic degenerative changes are noted at the manubriosternal joint with some associated sclerosis. CT ABDOMEN PELVIS FINDINGS Hepatobiliary: Diffuse fatty infiltration of the liver is noted. No discrete lesion is present. No contusion is present. The common bile duct and gallbladder are normal. Pancreas: Unremarkable. No pancreatic ductal dilatation or surrounding inflammatory changes. Spleen: No splenic injury or perisplenic hematoma. Adrenals/Urinary Tract: No adrenal hemorrhage  or renal injury identified. Bladder is unremarkable. Cortical thinning is noted posteriorly in the right kidney. Calcifications at the lower pole of the right kidney measure up to 7 mm, but are nonobstructive. The ureters are within normal limits bilaterally. The urinary bladder is within normal limits. Stomach/Bowel: Stomach is within normal limits. Appendix appears normal. No evidence of bowel wall thickening, distention, or inflammatory changes. Vascular/Lymphatic: Vascular calcifications are noted at the aorta and branch vessels without aneurysm. No significant adenopathy is present. Reproductive: Prostate is unremarkable. Other: No abdominal wall hernia or abnormality. No abdominopelvic ascites. Musculoskeletal: Vertebral body heights are maintained. Degenerative changes are noted at the SI joints bilaterally with fusion on the right. Bony pelvis is otherwise unremarkable. The hips are located and within normal limits bilaterally. IMPRESSION: 1. No acute trauma to the chest, abdomen, or pelvis. 2. Patchy ill-defined airspace opacities in the right upper lobe and to a lesser extent in the superior segment of the lower lobes bilaterally are concerning for infection. 3. Remote bilateral rib fractures. 4. Chronic degenerative changes at the manubriosternal joint without acute fracture. 5. Hepatic steatosis. 6. Nonobstructing stones at the lower pole of the right kidney. 7. Aortic Atherosclerosis (ICD10-I70.0). These results were called by telephone at the time of interpretation on 10/06/2020 at 6:28am to provider CORNETT, who verbally acknowledged these results. Electronically Signed   By: Marin Robertshristopher  Mattern M.D.   On: 10/06/2020 06:34   DG Pelvis Portable  Result Date: 10/06/2020 CLINICAL DATA:  Level 1 trauma.  Fall.  Hypotensive. EXAM: PORTABLE PELVIS 1-2 VIEWS COMPARISON:  None. FINDINGS: No acute or healing fractures are present. Calcifications are present along the vas deferens. IMPRESSION: 1. No acute  or healing fractures. 2. Calcifications along the vas deferens. Electronically Signed   By: Marin Robertshristopher  Mattern M.D.   On: 10/06/2020 06:14   DG Chest Portable 1 View  Result Date: 10/06/2020 CLINICAL DATA:  Head trauma.  Patient found down.  Trauma to head. EXAM: PORTABLE CHEST 1 VIEW COMPARISON:  None. FINDINGS: Heart size is normal. Lungs are clear. Atherosclerotic changes are noted at the aortic arch. No edema or effusion is present. Remote bilateral rib fractures are present. No acute fractures are present. IMPRESSION: 1. No acute cardiopulmonary disease. 2. Remote bilateral rib fractures. 3. Atherosclerosis. Electronically Signed   By: Marin Robertshristopher  Mattern M.D.   On: 10/06/2020 06:13    ROS 10 point review of systems is negative except as listed above in HPI.  Blood pressure (!) 80/44, pulse (!) 109, temperature (!) 97.4 F (36.3 C), temperature source Oral, resp. rate 15, height  (1.626 m), weight 55.8 kg, SpO2 99 %.  Secondary Survey:  GCS: E(4)//V(5)//M(6) Constitutional: well-developed, well-nourished Skull: normocephalic, 3cm lac to scalp, hemostatic Eyes: pupils equal, round, reactive to light, 2mm b/l, moist conjunctiva Face/ENT: midface stable without deformity, normal dentition, external inspection of ears and nose normal, hearing intact Oropharynx: normal oropharyngeal mucosa, no blood Neck: no thyromegaly, trachea midline, c-collar removed by EDP, no midline cervical tenderness to palpation, no C-spine stepoffs Chest: breath sounds equal bilaterally, normal respiratory effort, no midline or lateral chest wall tenderness to palpation/deformity Abdomen: soft, NT, no bruising, no hepatosplenomegaly FAST: not performed Pelvis: stable GU: no blood at urethral meatus of penis, no scrotal masses or abnormality Back: no wounds, no T/L spine TTP, no T/L spine stepoffs Rectal: deferred Extremities: 2+  radial and pedal pulses bilaterally, motor and sensation intact to  bilateral UE and LE, no peripheral edema MSK: unable to assess gait/station, no clubbing/cyanosis of fingers/toes, normal ROM of all four extremities Skin: warm, dry, no rashes    Assessment/Plan: Problem List 58M s/p GLF  Plan Scalp laceration - hemostatic, no further intervention indicated Acute medical problems - recommend admission to [facility] for management of AF-RVR and borderline hypotension Dispo - Cleared from trauma perspective, awaiting disposition determination by CCM service  Diamantina Monks, MD General and Trauma Surgery [facility] Surgery

## 2020-10-06 NOTE — H&P (Addendum)
NAME:  Bradley Hall, MRN:  [id number], DOB:  1966-03-04, LOS: 0 ADMISSION DATE:  10/06/2020, CONSULTATION DATE:  10/06/2020 REFERRING MD:  Dr. Clayborne Dana, CHIEF COMPLAINT:  Ground Level Fall    Brief History   [name] is a 54 y/o male with PMHx of CVA x 3, T2DM, AUD, who presented with a ground level fall at home and admitted for undifferentiated shock.   History of present illness   [name] is a 54 y/o male with PMHx of CVA x 3, T2DM, ? AUD, who presented with a fall at home and admitted for undifferentiated shock.   Per EDP: Patient presents as a level 1 trauma secondary to Plavix, head injury, bleeding in his bed for day and being hypotensive at 70/50.  Patient had a mechanical fall.  Started bleeding and lay down in bed.  Paramedics showed me a picture of the bed and it was significantly drenched with blood.  Patient apparently was hemostatic on arrival with dried blood to his whole scalp and face.  He is complaining of head pain, neck pain, shoulder pain and bilateral hip pain.  Per [name], patient had a mechanical fall at home and hit his head. He laid down in bed and noted he was bleeding heavily. Initially patient did not want to come to the ED. After several hours with the severity of bleeding and development of dizziness, patient agreed to be evaluated. Wife denies any stroke like symptoms prior to or after the fall. She endorses some alcohol use but unclear history. She denies any cold symptoms. She notes a hx of proteinuria and elevated potassium in the past.   Contacted patient's PCP: [name] has a history of heavy alcohol use with history of withdrawals as well. Recent visit to their clinic on the 9th. Labs at that time showed: Normal kidney function with creatinine of 1.18 and BUN of 10. A1c was 11.1%. Patient's wife is an Char[employer] fundraiser.  Past Medical History  CVA, T2DM, HTN, AUD,   Significant Hospital Events   10/13 >> Admitted to [facility]   Consults:  Trauma  Surgery  Procedures:  N/A  Significant Diagnostic Tests:  CT Head 10/13 >> Soft tissue swelling/hematoma near the vertex without underlying fracture CT Chest/Abdomen/Pelvis 10/13 >> patchy ill-defined airspace opacities in the right upper lobe and somewhat in the bilateral lower lobes, remote bilateral rib fractures, hepatic steatosis, non-obstructing kidney stone on the R  Micro Data:  COVID-19, influenza 10/13 >> Negative Blood Cultures 10/13 >> Pending   Antimicrobials:  Azithromycin 10/13 >>  Ceftriaxone 10/13 >>    Interim history/subjective:  As above.   Objective   Blood pressure (!) 80/44, pulse (!) 109, temperature (!) 97.4 F (36.3 C), temperature source Oral, resp. rate 15, height 5\' 4"  (1.626 m), weight 55.8 kg, SpO2 99 %.        Intake/Output Summary (Last 24 hours) at 10/06/2020 1047 Last data filed at 10/06/2020 0947 Gross per 24 hour  Intake 4330 ml  Output 0 ml  Net 4330 ml   Filed Weights   10/06/20 0556  Weight: 55.8 kg   Physical Exam Vitals and nursing note reviewed.  Constitutional:      General: He is not in acute distress.    Appearance: He is not toxic-appearing.  HENT:     Head:     Comments: Dried blood spread diffusely over entire head with sutured laceration located on the back of the scalp. No active bleeding noted.  Mouth/Throat:     Mouth: Mucous membranes are dry.     Pharynx: Oropharynx is clear.  Eyes:     General: No scleral icterus.    Extraocular Movements: Extraocular movements intact.  Cardiovascular:     Rate and Rhythm: Regular rhythm. Tachycardia present.     Heart sounds: No murmur heard.   Pulmonary:     Effort: Pulmonary effort is normal. No respiratory distress.     Breath sounds: No wheezing or rales.  Abdominal:     General: Bowel sounds are normal. There is no distension.     Palpations: Abdomen is soft.  Skin:    General: Skin is warm and dry.  Neurological:     General: No focal deficit present.      Mental Status: He is alert.     Comments: Moving all extremities against gravity. Alert and oriented.     Resolved Hospital Problem list   N/A  Assessment & Plan:   # Undifferentiated Shock: Possible hemorrhagic in the setting of acute blood loss but without significantly low hemoglobin (unknown baseline though, ct head negative) versus septic shock given consolidations on CT imaging, leukocytosis with high risk of aspiration pneumonia. Lactic acidosis noted with LA of 7.8.  # Acute Blood Loss Anemia  # Aspiration Pneumonia vs Pneumonitis  - s/p 2 liters of NS, currently receiving 1 units of pRBCs - Mildly hypotensive on examination, however MAPs are maintaining > 65 - Will hold off on pressors for now, if MAPs begin to decrease below 65, will re-assess - Continue Azithromycin and Ceftriaxone (Day 1/3) - Blood cultures pending   - Will trend hemoglobin; transfuse additional 1 unit if hemoglobin < 7 - Trend lactic acid   # Acute Kidney Disease # Hyperkalemia   - New onset with baseline creatinine of ~1.1 - EKG on arrival with T wave peaking and p wave flattening.  - BUN/Cr ratio of 8 indicating likely intra-renal process, likely ATN - Repeat BMP pending - Lokelma 10g TID for 4 doses - Place foley - Urinalysis  - Strict I/Os  - Avoid nephrotoxic agents   # Alcohol Use Disorder - Hx of heavy AUD with withdrawals per PCP - CIWA with Ativan PRN   # Type 2 Diabetes Mellitus: Uncontrolled with last a1c of 11.1%.  - A1c pending  - Start Levemir 12 units BID - SSI (resistant)   # Hx of HTN  # CAD - Holding anti-hypertensives  # Hx of CVA - Holding Plavix at this time - Restart Aspirin tomorrow   Best practice:  Diet: Renal/CM Pain/Anxiety/Delirium protocol (if indicated): Not indicated VAP protocol (if indicated): Not indicated DVT prophylaxis: Contraindicated due to recent bleeding  GI prophylaxis: Protonix  Glucose control: SSI Mobility: Bedrest  Code Status:  FULL Family Communication: Wife updated via telephone  Disposition: ICU   Labs   CBC: Recent Labs  Lab 10/06/20 0556  WBC 21.5*  HGB 9.0*  HCT 27.2*  MCV 93.8  PLT 255    Basic Metabolic Panel: Recent Labs  Lab 10/06/20 0556  NA 129*  K 7.1*  CL 90*  CO2 13*  GLUCOSE 434*  BUN 32*  CREATININE 4.18*  CALCIUM 8.6*   GFR: Estimated Creatinine Clearance: 15.9 mL/min (A) (by C-G formula based on SCr of 4.18 mg/dL (H)). Recent Labs  Lab 10/06/20 0556 10/06/20 0745  WBC 21.5*  --   LATICACIDVEN  --  7.8*    Liver Function Tests: Recent Labs  Lab 10/06/20 0556  AST 45*  ALT 43  ALKPHOS 62  BILITOT 0.8  PROT 6.5  ALBUMIN 3.9   No results for input(s): LIPASE, AMYLASE in the last 168 hours. No results for input(s): AMMONIA in the last 168 hours.  ABG No results found for: PHART, PCO2ART, PO2ART, HCO3, TCO2, ACIDBASEDEF, O2SAT   Coagulation Profile: Recent Labs  Lab 10/06/20 0556  INR 1.2    Cardiac Enzymes: Recent Labs  Lab 10/06/20 0556  CKTOTAL 277    HbA1C: No results found for: HGBA1C  CBG: No results for input(s): GLUCAP in the last 168 hours.  Review of Systems:   Negative except as noted above.   Past Medical History  He,  has no past medical history on file.   Surgical History   No known history   Social History  Lives with wife History of AUD  Family History   His family history is not on file.   Allergies No Known Allergies   Home Medications  Prior to Admission medications   Medication Sig Start Date End Date Taking? Authorizing Provider  clopidogrel (PLAVIX) 75 MG tablet Take 75 mg by mouth daily. 09/25/20  Yes [provider]  enalapril (VASOTEC) 10 MG tablet Take 10 mg by mouth daily. 09/13/20  Yes [provider]  glimepiride (AMARYL) 4 MG tablet Take 4 mg by mouth daily. 10/02/20  Yes [provider]  metFORMIN (GLUCOPHAGE) 850 MG tablet Take 850 mg by mouth 2 (two) times daily. 05/07/20   Yes [provider]  rosuvastatin (CRESTOR) 20 MG tablet Take 20 mg by mouth at bedtime. 09/13/20  Yes [provider]    Dr. Verdene Lennert Internal Medicine PGY-2  10/06/2020, 10:48 AM

## 2020-10-06 NOTE — ED Notes (Signed)
Dr. Jacqulyn Bath made aware of pts lactic acid.

## 2020-10-06 NOTE — ED Notes (Signed)
[phone], Genella Mech Wife would like an update.

## 2020-10-06 NOTE — ED Notes (Signed)
Pt given verbal consent for blood transfusion. Witnessed by this RN and Tyrone Sage, Charity fundraiser. Pt unable to sign d/t deficits from previous strokes

## 2020-10-06 NOTE — Progress Notes (Signed)
°   10/06/20 0700  Clinical Encounter Type  Visited With Patient not available  Visit Type Trauma  Referral From Nurse  Consult/Referral To Chaplain  The chaplain responded to level 1 trauma page. No family present. No chaplain services needed. The chaplain will follow up as needed.

## 2020-10-06 NOTE — Progress Notes (Signed)
Orthopedic Tech Progress Note Patient Details:  Bradley Hall 1966/04/27 [id number] Level 1 Trauma Patient ID: [name], male   DOB: 1966-01-07, 54 y.o.   MRN: [id number]   Gerald Stabs 10/06/2020, 5:50 AM

## 2020-10-06 NOTE — ED Notes (Signed)
Ct transferred to CT

## 2020-10-06 NOTE — ED Provider Notes (Signed)
Blood pressure 107/63, pulse (!) 118, temperature 98.6 F (37 C), temperature source Oral, resp. rate (!) 24, height 5\' 4"  (1.626 m), weight 55.8 kg, SpO2 100 %.  Assuming care from Dr. .  In short, Bradley Hall is a 54 y.o. male with a chief complaint of Fall .  Refer to the original H&P for additional details.  The current plan of care is to f/u on labs. CT reviewed with no acute findings. Patient has been given insulin and calcium along with IVF for hyperkalemia. Patient with significantly elevated Cr but unknown baseline. Patient on monitor and PRBC transfusion ordered.   08:35 AM  Lactate elevated at 7.8. Patient has received abx for findings on CT chest but my overall suspicion for sepsis is low. Doubt that patient is in DKA which was considered earlier. Lactate likely accounts for gap.   The patient is noted to have a lactate>4. With the current information available to me, I don't think the patient is in septic shock. The lactate>4, is related to trauma.  Spoke with trauma service. No acute findings on CT. Recommend medicine admit. Spoke with nursing. Will repeat lactate and BMP. Patient is awake and alert. BP remains soft. Blood transfusion running now. Can repeat CBC 2 hours after transfusion but defer to admit team.   10:00 AM  Patient's mental status unchanged. 2nd U PRBC on it's way. Remains intermittently hypotensive. Will discuss case with ICU.   Discussed patient's case with ICU to request admission. Patient and family (if present) updated with plan. Care transferred to ICU service.  I reviewed all nursing notes, vitals, pertinent old records, EKGs, labs, imaging (as available).   CRITICAL CARE Performed by: 01-20-1971 Total critical care time: 45 minutes Critical care time was exclusive of separately billable procedures and treating other patients. Critical care was necessary to treat or prevent imminent or life-threatening deterioration. Critical care was time  spent personally by me on the following activities: development of treatment plan with patient and/or surrogate as well as nursing, discussions with consultants, evaluation of patient's response to treatment, examination of patient, obtaining history from patient or surrogate, ordering and performing treatments and interventions, ordering and review of laboratory studies, ordering and review of radiographic studies, pulse oximetry and re-evaluation of patient's condition.  Maia Plan, MD Emergency Medicine    Ira Dougher, Alona Bene, MD 10/06/20 1018

## 2020-10-06 NOTE — ED Provider Notes (Signed)
[facility] EMERGENCY DEPARTMENT Provider Note   CSN: [id number] Arrival date & time: 10/06/20  0539     History Chief Complaint  Patient presents with  . Fall    Bradley Hall is a 54 y.o. male.  Patient presents as a level 1 trauma secondary to Plavix, head injury, bleeding in his bed for day and being hypotensive at 70/50.  Patient had a mechanical fall.  Started bleeding and lay down in bed.  Paramedics showed me a picture of the bed and it was significantly drenched with blood.  Patient apparently was hemostatic on arrival with dried blood to his whole scalp and face.  He is complaining of head pain, neck pain, shoulder pain and bilateral hip pain.   Fall       History reviewed. No pertinent past medical history.  There are no problems to display for this patient.   History reviewed. No pertinent family history.  Social History   Tobacco Use  . Smoking status: Not on file  Substance Use Topics  . Alcohol use: Not on file  . Drug use: Not on file    Home Medications Prior to Admission medications   Not on File    Allergies    Patient has no known allergies.  Review of Systems   Review of Systems  All other systems reviewed and are negative.   Physical Exam Updated Vital Signs BP 117/61   Pulse (!) 127   Resp 19   Ht 5\' 4"  (1.626 m)   Wt 55.8 kg   SpO2 100%   BMI 21.11 kg/m   Physical Exam Vitals and nursing note reviewed.  Constitutional:      Appearance: He is well-developed.  HENT:     Head: Normocephalic.     Comments: Dried blood to whole scalp and face    Nose: Nose normal. No congestion or rhinorrhea.  Eyes:     Pupils: Pupils are equal, round, and reactive to light.  Cardiovascular:     Rate and Rhythm: Normal rate.  Pulmonary:     Effort: Pulmonary effort is normal. No respiratory distress.  Abdominal:     General: Abdomen is flat. There is no distension.  Musculoskeletal:        General: Normal range of  motion.     Cervical back: Normal range of motion.     Comments: Some cervical spine tenderness, but no thoracic spine tenderness or Lumbar spine tenderness.  No tenderness or pain with palpation and full ROM of all joints in upper and lower extremities.  No ecchymosis or other signs of trauma on back or extremities.  No Pain with AP or lateral compression of ribs.  Positive Paracervical ttp, paraspinal ttp   Skin:    General: Skin is warm and dry.     Coloration: Skin is not jaundiced or pale.  Neurological:     General: No focal deficit present.     Mental Status: He is alert.     ED Results / Procedures / Treatments   Labs (all labs ordered are listed, but only abnormal results are displayed) Labs Reviewed  CBC - Abnormal; Notable for the following components:      Result Value   WBC 21.5 (*)    RBC 2.90 (*)    Hemoglobin 9.0 (*)    HCT 27.2 (*)    All other components within normal limits  RESPIRATORY PANEL BY RT PCR (FLU A&B, COVID)  ETHANOL  PROTIME-INR  CDS SEROLOGY  COMPREHENSIVE METABOLIC PANEL  CK  SAMPLE TO BLOOD BANK  PREPARE RBC (CROSSMATCH)    EKG EKG Interpretation  Date/Time:  Wednesday October 06 2020 05:48:21 EDT Ventricular Rate:  136 PR Interval:    QRS Duration: 70 QT Interval:  383 QTC Calculation: 577 R Axis:   -70 Text Interpretation: Sinus or ectopic atrial tachycardia Inferior infarct, old Probable anteroseptal infarct, recent Lateral leads are also involved Prolonged QT interval Confirmed by Marily Memos [phone]) on 10/06/2020 6:19:07 AM   Radiology CT Head Wo Contrast  Result Date: 10/06/2020 CLINICAL DATA:  Head trauma. Abnormal mental status. Fall 24 hours ago. EXAM: CT HEAD WITHOUT CONTRAST TECHNIQUE: Contiguous axial images were obtained from the base of the skull through the vertex without intravenous contrast. COMPARISON:  CT head without contrast 05/11/2018 FINDINGS: Brain: Moderately advanced atrophy and white matter disease  bilaterally is stable. Remote lacunar infarcts are again noted in the basal ganglia, stable. Stable remote lacunar infarcts are present in the thalami bilaterally. No acute infarct, hemorrhage, or mass lesion is present. The ventricles are proportionate to the degree of atrophy. No significant extraaxial fluid collection is present. Vascular: Atherosclerotic calcifications are again noted within the cavernous internal carotid arteries bilaterally. No hyperdense vessel is present. Skull: Soft tissue swelling/hematoma is noted near the vertex. No underlying fracture is present. Sinuses/Orbits: Anterior right paranasal sinus disease is present. Fluid is present in the right maxillary sinus and inferior right frontal sinus. Mucosal thickening is present in the anterior ethmoid air cells, right greater than left. Minimal mucosal thickening is present in the left sphenoid sinus. The mastoid air cells are clear. Right lens replacement is noted since the prior exam. Focal soft tissue density is again noted in the posterior left globe. Question chronic retinal detachment. IMPRESSION: 1. Soft tissue swelling/hematoma near the vertex without underlying fracture. 2. No acute intracranial abnormality or significant interval change. 3. Stable moderately advanced atrophy and white matter disease. 4. Stable remote lacunar infarcts of the basal ganglia and thalami bilaterally. 5. Interval right lens replacement. 6. Stable focal soft tissue density in the posterior left globe. Question chronic retinal detachment. Electronically Signed   By: Marin Roberts M.D.   On: 10/06/2020 06:20   CT Cervical Spine Wo Contrast  Result Date: 10/06/2020 CLINICAL DATA:  Chest trauma.  Trauma to head.  Fall.  Head pain. EXAM: CT CERVICAL SPINE WITHOUT CONTRAST TECHNIQUE: Multidetector CT imaging of the cervical spine was performed without intravenous contrast. Multiplanar CT image reconstructions were also generated. COMPARISON:  CT a of  the neck 05/12/2018 FINDINGS: Alignment: No significant listhesis is present. Skull base and vertebrae: Craniocervical junction is normal. Mild degenerative changes C1-2 are stable. Vertebral body heights are normal. No acute or healing fracture is present. Soft tissues and spinal canal: No prevertebral fluid or swelling. No visible canal hematoma. Disc levels: Minimal degenerative changes are present, most evident at C2-3. No significant change is present. Mandible is located. Visualized portions are intact. The patient is edentulous. Upper chest: Lung apices are clear. Thoracic inlet is within normal limits. IMPRESSION: 1. No acute fracture or traumatic subluxation. 2. Minimal degenerative changes are stable. Electronically Signed   By: Marin Roberts M.D.   On: 10/06/2020 06:25   DG Pelvis Portable  Result Date: 10/06/2020 CLINICAL DATA:  Level 1 trauma.  Fall.  Hypotensive. EXAM: PORTABLE PELVIS 1-2 VIEWS COMPARISON:  None. FINDINGS: No acute or healing fractures are present. Calcifications are present along the vas deferens. IMPRESSION: 1.  No acute or healing fractures. 2. Calcifications along the vas deferens. Electronically Signed   By: Marin Roberts M.D.   On: 10/06/2020 06:14   DG Chest Portable 1 View  Result Date: 10/06/2020 CLINICAL DATA:  Head trauma.  Patient found down.  Trauma to head. EXAM: PORTABLE CHEST 1 VIEW COMPARISON:  None. FINDINGS: Heart size is normal. Lungs are clear. Atherosclerotic changes are noted at the aortic arch. No edema or effusion is present. Remote bilateral rib fractures are present. No acute fractures are present. IMPRESSION: 1. No acute cardiopulmonary disease. 2. Remote bilateral rib fractures. 3. Atherosclerosis. Electronically Signed   By: Marin Roberts M.D.   On: 10/06/2020 06:13    Procedures .Critical Care Performed by: Marily Memos, MD Authorized by: Marily Memos, MD   Critical care provider statement:    Critical care time  (minutes):  45   Critical care was necessary to treat or prevent imminent or life-threatening deterioration of the following conditions:  Trauma, shock and circulatory failure   Critical care was time spent personally by me on the following activities:  Discussions with consultants, evaluation of patient's response to treatment, examination of patient, ordering and performing treatments and interventions, ordering and review of laboratory studies, ordering and review of radiographic studies, pulse oximetry, re-evaluation of patient's condition, obtaining history from patient or surrogate and review of old charts   (including critical care time)  Medications Ordered in ED Medications  0.9 %  sodium chloride infusion (Manually program via Guardrails IV Fluids) (has no administration in time range)  ondansetron (ZOFRAN) injection 8 mg (8 mg Intravenous Given 10/06/20 0557)  lactated ringers bolus 1,000 mL (1,000 mLs Intravenous New Bag/Given 10/06/20 0610)  iohexol (OMNIPAQUE) 300 MG/ML solution 100 mL (100 mLs Intravenous Contrast Given 10/06/20 5465)    ED Course  I have reviewed the triage vital signs and the nursing notes.  Pertinent labs & imaging results that were available during my care of the patient were reviewed by me and considered in my medical decision making (see chart for details).    MDM Rules/Calculators/A&P                         Concern for acute blood loss anemia secondary to the traumatic wound to his scalp.  Concern for also rhabdomyolysis with his diffuse body aches, tachycardiac, clinical dehydration. Labs ordered. Fluids ordered.   Significant presumed AKI and acidosis. No rhabdo. Significantly elevated K, calcium/insulin/bicarb all ordered.   Suspect dehydration likely a combination of blood loss and decreased PO.  Care transferred pending rest of images/labs/trauma recommendations, possibly medicine admit.   Final Clinical Impression(s) / ED Diagnoses Final  diagnoses:  Hemorrhagic shock (HCC)  Injury of head, initial encounter  Fall, initial encounter  Hyperkalemia  Renal failure, unspecified chronicity    Rx / DC Orders ED Discharge Orders    None       Evita Merida, Barbara Cower, MD 10/07/20 [phone]

## 2020-10-07 DIAGNOSIS — N19 Unspecified kidney failure: Secondary | ICD-10-CM | POA: Diagnosis not present

## 2020-10-07 LAB — BPAM RBC
Blood Product Expiration Date: 202111052359
Blood Product Expiration Date: 202111152359
ISSUE DATE / TIME: 202110130821
ISSUE DATE / TIME: 202110131707
Unit Type and Rh: 5100
Unit Type and Rh: 5100

## 2020-10-07 LAB — BASIC METABOLIC PANEL
Anion gap: 11 (ref 5–15)
BUN: 18 mg/dL (ref 6–20)
CO2: 27 mmol/L (ref 22–32)
Calcium: 8.6 mg/dL — ABNORMAL LOW (ref 8.9–10.3)
Chloride: 101 mmol/L (ref 98–111)
Creatinine, Ser: 1.5 mg/dL — ABNORMAL HIGH (ref 0.61–1.24)
GFR, Estimated: 52 mL/min — ABNORMAL LOW (ref >60)
Glucose, Bld: 88 mg/dL (ref 70–99)
Potassium: 3.8 mmol/L (ref 3.5–5.1)
Sodium: 139 mmol/L (ref 135–145)

## 2020-10-07 LAB — CBC
HCT: 29.9 % — ABNORMAL LOW (ref 39.0–52.0)
Hemoglobin: 10.3 g/dL — ABNORMAL LOW (ref 13.0–17.0)
MCH: 30 pg (ref 26.0–34.0)
MCHC: 34.4 g/dL (ref 30.0–36.0)
MCV: 87.2 fL (ref 80.0–100.0)
Platelets: 162 10*3/uL (ref 150–400)
RBC: 3.43 MIL/uL — ABNORMAL LOW (ref 4.22–5.81)
RDW: 13.4 % (ref 11.5–15.5)
WBC: 14.8 10*3/uL — ABNORMAL HIGH (ref 4.0–10.5)
nRBC: 0 % (ref 0.0–0.2)

## 2020-10-07 LAB — CDS SEROLOGY

## 2020-10-07 LAB — HEMOGLOBIN A1C
Hgb A1c MFr Bld: 8.4 % — ABNORMAL HIGH (ref 4.8–5.6)
Mean Plasma Glucose: 194 mg/dL

## 2020-10-07 LAB — TYPE AND SCREEN
ABO/RH(D): O POS
Antibody Screen: NEGATIVE
Unit division: 0
Unit division: 0

## 2020-10-07 LAB — GLUCOSE, CAPILLARY
Glucose-Capillary: 53 mg/dL — ABNORMAL LOW (ref 70–99)
Glucose-Capillary: 56 mg/dL — ABNORMAL LOW (ref 70–99)
Glucose-Capillary: 75 mg/dL (ref 70–99)
Glucose-Capillary: 173 mg/dL — ABNORMAL HIGH (ref 70–99)
Glucose-Capillary: 105 mg/dL — ABNORMAL HIGH (ref 70–99)
Glucose-Capillary: 228 mg/dL — ABNORMAL HIGH (ref 70–99)
Glucose-Capillary: 250 mg/dL — ABNORMAL HIGH (ref 70–99)

## 2020-10-07 LAB — MAGNESIUM: Magnesium: 1.7 mg/dL (ref 1.7–2.4)

## 2020-10-07 LAB — PHOSPHORUS: Phosphorus: 2.3 mg/dL — ABNORMAL LOW (ref 2.5–4.6)

## 2020-10-07 MED ORDER — INSULIN DETEMIR 100 UNIT/ML ~~LOC~~ SOLN
10.0000 [IU] | Freq: Two times a day (BID) | SUBCUTANEOUS | Status: DC
  Administered 2020-10-07 – 2020-10-08 (×2): 10 [IU] via SUBCUTANEOUS
  Filled 2020-10-07 (×3): qty 0.1

## 2020-10-07 MED ORDER — INSULIN DETEMIR 100 UNIT/ML ~~LOC~~ SOLN
10.0000 [IU] | Freq: Two times a day (BID) | SUBCUTANEOUS | Status: DC
  Filled 2020-10-07 (×2): qty 0.1

## 2020-10-07 MED ORDER — MAGNESIUM SULFATE 2 GM/50ML IV SOLN
2.0000 g | Freq: Once | INTRAVENOUS | Status: AC
  Administered 2020-10-07: 2 g via INTRAVENOUS
  Filled 2020-10-07: qty 50

## 2020-10-07 MED ORDER — INSULIN ASPART 100 UNIT/ML ~~LOC~~ SOLN
0.0000 [IU] | Freq: Three times a day (TID) | SUBCUTANEOUS | Status: DC
  Administered 2020-10-07 – 2020-10-08 (×4): 7 [IU] via SUBCUTANEOUS

## 2020-10-07 NOTE — Evaluation (Signed)
Physical Therapy Evaluation Patient Details Name: Bradley Hall MRN: [id number] DOB: Oct 01, 1966 Today's Date: 10/07/2020   History of Present Illness  54 y/o male with PMHx of CVA x 3, T2DM, ? AUD, and ETOH abuse, who presented with a fall at home and admitted for undifferentiated shock. After fall pt was bleeding from head and layed down in bed for several hours prior to calling EMS. CT of head showed Soft tissue swelling/hemotoma near the vertex without underlying fx; No acute intracranial abnormality. Stable moderately advanced atropy and white matter disease; stable remote lacunuar infarcts of the basal ganglia and thalami; Stable focal soft tissue density in the posterior Left globe, question chronic retinal detachment   Clinical Impression  Pt presents to PT with deficits in functional mobility, gait, balance, endurance, awareness of deficits. Pt has reduced insight into deficits at this time, requiring UE support and assistance to maintain balance during all OOB mobility. Pt with chronic vision deficits which exacerbate his falls risk. Per chart review pt has a history of falls however the pt reports this is his only recent fall. Due to high falls risk, vision and mobility impairments, PT recommends SNF placement at this time. To return home safely the pt needs 24/7 assistance due to his high falls risk and poor insight into deficits.    Follow Up Recommendations SNF (HHPT if pt refuses placement)    Equipment Recommendations  Rolling walker with 5" wheels    Recommendations for Other Services       Precautions / Restrictions Precautions Precautions: Fall Precaution Comments: Per chart, pt with h/o falls, however, pt denies this  Restrictions Weight Bearing Restrictions: No      Mobility  Bed Mobility Overal bed mobility: Needs Assistance Bed Mobility: Supine to Sit     Supine to sit: Min assist     General bed mobility comments: min A to move Rt LE off bed and for  balance initially   Transfers Overall transfer level: Needs assistance Equipment used: 2 person hand held assist Transfers: Sit to/from [employer] Sit to Stand: Mod assist;+2 safety/equipment Stand pivot transfers: Mod assist;+2 safety/equipment       General transfer comment: pt utilizes wide BOS, and requires assist to steady   Ambulation/Gait Ambulation/Gait assistance: Mod assist;+2 physical assistance Gait Distance (Feet): 5 Feet Assistive device: 2 person hand held assist Gait Pattern/deviations: Step-to pattern Gait velocity: reduced Gait velocity interpretation: <1.31 ft/sec, indicative of household ambulator General Gait Details: pt with short step to gait, reduced knee flexion of RLE during gait cycle, increased lateral sway  Stairs            Wheelchair Mobility    Modified Rankin (Stroke Patients Only)       Balance Overall balance assessment: Needs assistance Sitting-balance support: Feet supported Sitting balance-Leahy Scale: Fair Sitting balance - Comments: supervision to don socks   Standing balance support: Single extremity supported;During functional activity Standing balance-Leahy Scale: Poor Standing balance comment: requires UE support and min A for static standing                              Pertinent Vitals/Pain Pain Assessment: No/denies pain    Home Living Family/patient expects to be discharged to:: Private residence Living Arrangements: Spouse/significant other Available Help at Discharge: Family;Available PRN/intermittently Type of Home: House Home Access: Stairs to enter   Entrance Stairs-Number of Steps: 1-2 Home Layout: Two level;Bed/bath upstairs   Additional Comments:  wife lives with wife who is a Engineer, civil (consulting) and works 3 nights/week     Prior Function Level of Independence: Needs Geologist, engineering / Transfers Assistance Needed: Pt reports he stays in his bedroom in the bed all day, and only walks  to the bathroom using furniture, doors, and walls to stabliize self. He reports he only goes down the stairs to go to MD appointments   ADL's / Homemaking Assistance Needed: Pt reports he ambulates to the BR, and performs toileting mod I.  He reports he bathes without assistance, but does not wash his feet (which were unkept).  He reports he only bathes ~1x/week, and changes clothes every 2-3 days.  He reports he doesnt wear socks or shoes unless going to MD appointment, and then wife assists him.  He reports he eats with his fingers due to vision loss.  Wife delivers food to his room.  He is able to dial his phone         Hand Dominance   Dominant Hand: Right (impaired coordination due to old CVA )    Extremity/Trunk Assessment   Upper Extremity Assessment Upper Extremity Assessment: RUE deficits/detail RUE Deficits / Details: Pt demonstrates full AROM, however coordination impaired due to previous CVAs  RUE Coordination: decreased fine motor;decreased gross motor    Lower Extremity Assessment Lower Extremity Assessment: Defer to PT evaluation    Cervical / Trunk Assessment Cervical / Trunk Assessment: Normal  Communication   Communication: Expressive difficulties (mild dysarthria )  Cognition Arousal/Alertness: Awake/alert Behavior During Therapy: WFL for tasks assessed/performed Overall Cognitive Status: No family/caregiver present to determine baseline cognitive functioning                                 General Comments: Pt demonstrates decreased insight, but anticipate this may be his baseline       General Comments General comments (skin integrity, edema, etc.): HR to 129 with activity     Exercises     Assessment/Plan    PT Assessment Patient needs continued PT services  PT Problem List Decreased strength;Decreased activity tolerance;Decreased balance;Decreased mobility;Decreased cognition;Decreased knowledge of use of DME;Decreased safety  awareness;Decreased knowledge of precautions       PT Treatment Interventions DME instruction;Gait training;Stair training;Functional mobility training;Therapeutic activities;Balance training;Neuromuscular re-education;Cognitive remediation;Patient/family education    PT Goals (Current goals can be found in the Care Plan section)  Acute Rehab PT Goals Patient Stated Goal: to go home  PT Goal Formulation: With patient Time For Goal Achievement: 10/21/20 Potential to Achieve Goals: Poor    Frequency Min 2X/week   Barriers to discharge Decreased caregiver support      Co-evaluation PT/OT/SLP Co-Evaluation/Treatment: Yes Reason for Co-Treatment: Complexity of the patient's impairments (multi-system involvement);For patient/therapist safety;To address functional/ADL transfers PT goals addressed during session: Mobility/safety with mobility;Balance;Strengthening/ROM OT goals addressed during session: ADL's and self-care       AM-PAC PT "6 Clicks" Mobility  Outcome Measure Help needed turning from your back to your side while in a flat bed without using bedrails?: A Little Help needed moving from lying on your back to sitting on the side of a flat bed without using bedrails?: A Little Help needed moving to and from a bed to a chair (including a wheelchair)?: A Lot Help needed standing up from a chair using your arms (e.g., wheelchair or bedside chair)?: A Lot Help needed to walk in hospital room?: A Lot  Help needed climbing 3-5 steps with a railing? : Total 6 Click Score: 13    End of Session   Activity Tolerance: Patient tolerated treatment well Patient left: in chair;with call bell/phone within reach;with chair alarm set Nurse Communication: Mobility status PT Visit Diagnosis: Unsteadiness on feet (R26.81);Other abnormalities of gait and mobility (R26.89);Other symptoms and signs involving the nervous system (E08.144)    Time: 8185-6314 PT Time Calculation (min) (ACUTE ONLY):  38 min   Charges:   PT Evaluation $PT Eval Moderate Complexity: 1 Mod          Arlyss Gandy, PT, DPT Acute Rehabilitation Pager: [phone]   Arlyss Gandy 10/07/2020, 1:03 PM

## 2020-10-07 NOTE — Progress Notes (Signed)
NAME:  Bradley Hall, MRN:  [id number], DOB:  [birth year], LOS: 1 ADMISSION DATE:  10/06/2020, CONSULTATION DATE:  10/06/2020 REFERRING MD:  Dr. Clayborne Dana, CHIEF COMPLAINT:  Ground Level Fall    Brief History   [name] is a 54 y/o male with PMHx of CVA x 3, T2DM, AUD, who presented with a ground level fall at home and admitted for undifferentiated shock.   History of present illness   [name] is a 54 y/o male with PMHx of CVA x 3, T2DM, ? AUD, who presented with a fall at home and admitted for undifferentiated shock.   Per EDP: Patient presents as a level 1 trauma secondary to Plavix, head injury, bleeding in his bed for day and being hypotensive at 70/50.  Patient had a mechanical fall.  Started bleeding and lay down in bed.  Paramedics showed me a picture of the bed and it was significantly drenched with blood.  Patient apparently was hemostatic on arrival with dried blood to his whole scalp and face.  He is complaining of head pain, neck pain, shoulder pain and bilateral hip pain.  Per Mrs. Jomarie Longs, patient had a mechanical fall at home and hit his head. He laid down in bed and noted he was bleeding heavily. Initially patient did not want to come to the ED. After several hours with the severity of bleeding and development of dizziness, patient agreed to be evaluated. Wife denies any stroke like symptoms prior to or after the fall. She endorses some alcohol use but unclear history. She denies any cold symptoms. She notes a hx of proteinuria and elevated potassium in the past.   Contacted patient's PCP: Mr. Milbourne has a history of heavy alcohol use with history of withdrawals as well. Recent visit to their clinic on the 9th. Labs at that time showed: Normal kidney function with creatinine of 1.18 and BUN of 10. A1c was 11.1%. Patient's wife is an Charity fundraiser.  Past Medical History  CVA, T2DM, HTN, AUD,   Significant Hospital Events   10/13 >> Admitted to [facility]   Consults:  Trauma  Surgery  Procedures:  N/A  Significant Diagnostic Tests:  CT Head 10/13 >> Soft tissue swelling/hematoma near the vertex without underlying fracture CT Chest/Abdomen/Pelvis 10/13 >> patchy ill-defined airspace opacities in the right upper lobe and somewhat in the bilateral lower lobes, remote bilateral rib fractures, hepatic steatosis, non-obstructing kidney stone on the R  Micro Data:  COVID-19, influenza 10/13 >> Negative Blood Cultures 10/13 >> ngtd  Antimicrobials:  Azithromycin 10/13 >>  Ceftriaxone 10/13 >>    Interim history/subjective:  10/14: tmax 101.1 overnight, hypoglycemic event yesterday to 50's. bp improved and pt sitting up eating doing well. Stable for transfer from ICU at this time.   Objective   Blood pressure 116/63, pulse (!) 115, temperature 98.8 F (37.1 C), temperature source Axillary, resp. rate (!) 25, height 5\' 4"  (1.626 m), weight 54.2 kg, SpO2 95 %.        Intake/Output Summary (Last 24 hours) at 10/07/2020 0801 Last data filed at 10/07/2020 0800 Gross per 24 hour  Intake 6817.8 ml  Output 4630 ml  Net 2187.8 ml   Filed Weights   10/06/20 0556 10/07/20 0500  Weight: 55.8 kg 54.2 kg   Physical Exam Vitals and nursing note reviewed.  Constitutional:      General: He is not in acute distress.    Appearance: He is not toxic-appearing.  HENT:     Head:  Comments: Lac c/d/i    Mouth/Throat:     Mouth: Mucous membranes are moist.     Pharynx: Oropharynx is clear.  Eyes:     General: No scleral icterus.    Extraocular Movements: Extraocular movements intact.     Comments: But pt with poor eye sight and does not look directly at you when speaking   Cardiovascular:     Rate and Rhythm: Regular rhythm. Tachycardia present.     Heart sounds: No murmur heard.   Pulmonary:     Effort: Pulmonary effort is normal. No respiratory distress.     Breath sounds: No wheezing or rales.  Abdominal:     General: Bowel sounds are normal. There is no  distension.     Palpations: Abdomen is soft.  Skin:    General: Skin is warm and dry.     Comments: Palpable skin tags/ discoloration on chest and face  Neurological:     General: No focal deficit present.     Mental Status: He is alert.     Comments: Moving all extremities against gravity. Alert and oriented.     Resolved Hospital Problem list   N/A  Assessment & Plan:   # Undifferentiated Shock:  -improved with transfusion -BP improved but low normal -lactate downtrending.  -pt endorses overall improvement.  # Acute Blood Loss Anemia  # Aspiration Pneumonia vs Pneumonitis  - s/p 2 liters of NS, and 2U PRBC with improved hgb, stable ~10 -cxr from 10/13 without acute process however ct shows otherwise. Personally reviewed by me - BP improved - Continue Azithromycin and Ceftriaxone (Day 2/3) - Blood cultures pending but ngtd - lactate downtrending  # Acute Renal failure, non oliguric Metabolic acidosis # Hyperkalemia  :resolved - New onset with baseline creatinine of ~1.1 -improving indices, adequate uop  -stop lokelma - Urinalysis  - Strict I/Os  - Avoid nephrotoxic agents  -stopping sodium bicarb with bicarb 27  # Alcohol Use Disorder - Hx of heavy AUD with withdrawals per PCP - CIWA with Ativan PRN  -thiamine/folate  # Type 2 Diabetes Mellitus: Uncontrolled with last a1c of 11.1%.   - A1c 8.4 -did have hypoglycemic event yesterday to the 50's - decrease Levemir to 10 units BID - SSI (resistant) but ac/qhs  # Hx of HTN  # CAD - Holding anti-hypertensives  # Hx of CVA - Holding Plavix at this time, will need hgb monitoring with resumption - Restart Aspirin  Prolonged qt:  -recheck this am.  -ensure mag >2  and K >4 -avoid offending agents.  Best practice:  Diet: Renal/CM Pain/Anxiety/Delirium protocol (if indicated): Not indicated VAP protocol (if indicated): Not indicated DVT prophylaxis: Contraindicated due to recent bleeding  GI prophylaxis:  Protonix  Glucose control: SSI Mobility: Bedrest  Code Status: FULL Family Communication: Wife updated via telephone  Disposition: med tele, will ask TRH to assume care and CCM will sign off. Please call with any further questions.  Labs   CBC: Recent Labs  Lab 10/06/20 0556 10/06/20 1612 10/06/20 2109 10/07/20 0219  WBC 21.5*  --  13.6* 14.8*  HGB 9.0* 7.6* 10.1* 10.3*  HCT 27.2* 21.8* 28.6* 29.9*  MCV 93.8  --  88.0 87.2  PLT 255  --  164 162    Basic Metabolic Panel: Recent Labs  Lab 10/06/20 0556 10/06/20 1130 10/06/20 2109 10/07/20 0219  NA 129* 135 137 139  K 7.1* 4.3 4.0 3.8  CL 90* 101 99 101  CO2 13* 20*  27 27  GLUCOSE 434* 189* 141* 88  BUN 32* 25* 20 18  CREATININE 4.18* 2.41* 2.03* 1.50*  CALCIUM 8.6* 7.9* 8.2* 8.6*  MG  --   --   --  1.7  PHOS  --   --   --  2.3*   GFR: Estimated Creatinine Clearance: 43.2 mL/min (A) (by C-G formula based on SCr of 1.5 mg/dL (H)). Recent Labs  Lab 10/06/20 0556 10/06/20 0745 10/06/20 1130 10/06/20 2109 10/07/20 0219  WBC 21.5*  --   --  13.6* 14.8*  LATICACIDVEN  --  7.8* 3.4*  --   --     Liver Function Tests: Recent Labs  Lab 10/06/20 0556  AST 45*  ALT 43  ALKPHOS 62  BILITOT 0.8  PROT 6.5  ALBUMIN 3.9   No results for input(s): LIPASE, AMYLASE in the last 168 hours. No results for input(s): AMMONIA in the last 168 hours.  ABG No results found for: PHART, PCO2ART, PO2ART, HCO3, TCO2, ACIDBASEDEF, O2SAT   Coagulation Profile: Recent Labs  Lab 10/06/20 0556  INR 1.2    Cardiac Enzymes: Recent Labs  Lab 10/06/20 0556  CKTOTAL 277    HbA1C: Hgb A1c MFr Bld  Date/Time Value Ref Range Status  10/06/2020 11:30 AM 8.4 (H) 4.8 - 5.6 % Final    Comment:    (NOTE)         Prediabetes: 5.7 - 6.4         Diabetes: >6.4         Glycemic control for adults with diabetes: <7.0     CBG: Recent Labs  Lab 10/06/20 2004 10/06/20 2328 10/07/20 0331 10/07/20 0355 10/07/20 0429  GLUCAP  103* 176* 53* 56* 75   Critical care time: The patient is critically ill with multiple organ systems failure and requires high complexity decision making for assessment and support, frequent evaluation and titration of therapies, application of advanced monitoring technologies and extensive interpretation of multiple databases.  Critical care time 35 mins. This represents my time independent of the NPs time taking care of the pt. This is excluding procedures.    Briant Sites DO [facility] 10/07/2020, 8:01 AM

## 2020-10-07 NOTE — Evaluation (Signed)
Occupational Therapy Evaluation Patient Details Name: Bradley Hall MRN: [id number] DOB: 1966-03-27 Today's Date: 10/07/2020    History of Present Illness 53 y/o male with PMHx of CVA x 3, T2DM, ? AUD, and ETOH abuse, who presented with a fall at home and admitted for undifferentiated shock. After fall pt was bleeding from head and layed down in bed for several hours prior to calling EMS. CT of head showed Soft tissue swelling/hemotoma near the vertex without underlying fx; No acute intracranial abnormality. 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 He demonstrates the below listed deficits and will benefit from continued OT to maximize safety and independence with BADLs.  Pt presents to OT with Rt sided incoordination from previous CVAs, long standing visual deficits, impaired balance, decreased safety awareness (likely baseline).   He currently requires set up assist to mod A for ADLs and mod A for functional mobility.  He reports this is his baseline, but no family is available to confirm.  In reviewing his chart from last admission in 2019, it looks like his functional status has declined since that time as he requires increased assistance.  He reports he lives at home with his wife, who is an Charity fundraiser, and works 3rd shift.  He reports he stays in his upstairs bedroom, in the bed most to the time, only exiting to go to the BR and to go to MD appointments.  He reports he performs ADLs with intermittent assist, but only bathes ~1x/week, and changes clothes every 2-3 days.  Given his falls and current functional status, optimally feel he needs 24 hour supervision/assist or SNF, however, I am doubtful wife can provide this level of care, or that he would agree to placement.  Will  follow acutely.       Follow Up Recommendations  SNF;Supervision/Assistance - 24 hour    Equipment Recommendations  3 in 1 bedside commode;Tub/shower bench    Recommendations for Other Services       Precautions / Restrictions Precautions Precautions: Fall Precaution Comments: Per chart, pt with h/o falls, however, pt denies this       Mobility Bed Mobility Overal bed mobility: Needs Assistance Bed Mobility: Supine to Sit     Supine to sit: Min assist     General bed mobility comments: min A to move Rt LE off bed and for balance initially   Transfers Overall transfer level: Needs assistance Equipment used: 2 person hand held assist Transfers: Sit to/from [employer] Sit to Stand: Mod assist;+2 safety/equipment Stand pivot transfers: Mod assist;+2 safety/equipment       General transfer comment: pt utilizes wide BOS, and requires assist to steady     Balance Overall balance assessment: Needs assistance Sitting-balance support: Feet supported Sitting balance-Leahy Scale: Fair     Standing balance support: Single extremity supported;During functional activity Standing balance-Leahy Scale: Poor Standing balance comment: requires UE support and min A for static standing                            ADL either performed or assessed with clinical judgement   ADL Overall ADL's : Needs assistance/impaired Eating/Feeding: Set up;Sitting Eating/Feeding Details (indicate cue type and reason): Pt reports he eats with his fingers.  He utilizes  sensory imput to locate food items  Grooming: Wash/dry hands;Wash/dry face;Oral care;Brushing hair;Set up;Sitting   Upper Body Bathing: Set up;Sitting   Lower Body Bathing: Moderate assistance;Sit to/from stand Lower Body Bathing Details (indicate cue type and reason): Pt requires assist for balance, and demonstrates difficulty accessing feet  Upper Body Dressing : Minimal assistance;Sitting   Lower  Body Dressing: Moderate assistance;Sit to/from stand Lower Body Dressing Details (indicate cue type and reason): demonstrates difficulty donning socks and shoes  Toilet Transfer: Moderate assistance;+2 for safety/equipment;Ambulation;BSC;Regular Toilet;Comfort height toilet   Toileting- Clothing Manipulation and Hygiene: Sit to/from stand;Moderate assistance Toileting - Clothing Manipulation Details (indicate cue type and reason): for thoroughness and for balance.  Pt incontinent of urine      Functional mobility during ADLs: Moderate assistance;+2 for safety/equipment General ADL Comments: Pt states he is at, or close to his baseline with ADLs - wife not present to confirm      Vision Baseline Vision/History: Legally blind;Retinopathy Patient Visual Report: No change from baseline Additional Comments: Pt reports no vision in Lt eye, and poor vision in Rt eye.   He is able to identify people - looks out of the corner of his eye, and is able to see the numbers on his cell phone, to dial his phone, which he reports is his baseline      Perception Perception Perception Tested?: Yes   Praxis Praxis Praxis tested?: Within functional limits    Pertinent Vitals/Pain Pain Assessment: No/denies pain     Hand Dominance Right (impaired coordination due to old CVA )   Extremity/Trunk Assessment Upper Extremity Assessment Upper Extremity Assessment: RUE deficits/detail RUE Deficits / Details: Pt demonstrates full AROM, however coordination impaired due to previous CVAs  RUE Coordination: decreased fine motor;decreased gross motor   Lower Extremity Assessment Lower Extremity Assessment: Defer to PT evaluation   Cervical / Trunk Assessment Cervical / Trunk Assessment: Normal   Communication Communication Communication: Expressive difficulties (mild dysarthria )   Cognition Arousal/Alertness: Awake/alert Behavior During Therapy: WFL for tasks assessed/performed Overall Cognitive Status:  No family/caregiver present to determine baseline cognitive functioning                                 General Comments: Pt demonstrates decreased insight, but anticipate this may be his baseline    General Comments  HR to 129 with activity     Exercises     Shoulder Instructions      Home Living Family/patient expects to be discharged to:: Private residence Living Arrangements: Spouse/significant other Available Help at Discharge: Family;Available PRN/intermittently Type of Home: House Home Access: Stairs to enter Entrance Stairs-Number of Steps: 1-2   Home Layout: Two level;Bed/bath upstairs Alternate Level Stairs-Number of Steps: full flight    Bathroom Shower/Tub: Tub/shower unit;Curtain   Firefighter: Standard         Additional Comments: wife lives with wife who is a Engineer, civil (consulting) and works 3 nights/week       Prior Functioning/Environment Level of Independence: Needs Financial planner / Transfers Assistance Needed: Pt reports he stays in his bedroom in the bed all day, and only walks to the bathroom using furniture, doors, and walls to stabliize self. He reports he only goes down the stairs to go to MD appointments  ADL's / Homemaking Assistance Needed: Pt reports he ambulates to the BR, and performs toileting mod I.  He reports he bathes without assistance, but does not  wash his feet (which were unkept).  He reports he only bathes ~1x/week, and changes clothes every 2-3 days.  He reports he doesnt wear socks or shoes unless going to MD appointment, and then wife assists him.  He reports he eats with his fingers due to vision loss.  Wife delivers food to his room.  He is able to dial his phone             OT Problem List: Decreased strength;Decreased activity tolerance;Impaired balance (sitting and/or standing);Decreased coordination;Decreased safety awareness;Impaired UE functional use;Impaired vision/perception      OT Treatment/Interventions:  Self-care/ADL training;Neuromuscular education;DME and/or AE instruction;Therapeutic activities;Visual/perceptual remediation/compensation;Patient/family education;Balance training    OT Goals(Current goals can be found in the care plan section) Acute Rehab OT Goals Patient Stated Goal: to go home  OT Goal Formulation: With patient Time For Goal Achievement: 10/21/20 Potential to Achieve Goals: Good ADL Goals Pt Will Perform Lower Body Bathing: sit to/from stand;with min guard assist Pt Will Perform Lower Body Dressing: with min assist;sit to/from stand Pt Will Transfer to Toilet: with min assist;ambulating;regular height toilet;bedside commode;grab bars Pt Will Perform Toileting - Clothing Manipulation and hygiene: with min guard assist  OT Frequency: Min 2X/week   Barriers to D/C: Decreased caregiver support  wife works at night and unable to provide 24 hour assist        Co-evaluation PT/OT/SLP Co-Evaluation/Treatment: Yes Reason for Co-Treatment: Complexity of the patient's impairments (multi-system involvement);For patient/therapist safety;To address functional/ADL transfers   OT goals addressed during session: ADL's and self-care      AM-PAC OT "6 Clicks" Daily Activity     Outcome Measure Help from another person eating meals?: None Help from another person taking care of personal grooming?: A Little Help from another person toileting, which includes using toliet, bedpan, or urinal?: A Lot Help from another person bathing (including washing, rinsing, drying)?: A Lot Help from another person to put on and taking off regular upper body clothing?: A Little Help from another person to put on and taking off regular lower body clothing?: A Lot 6 Click Score: 16   End of Session Nurse Communication: Mobility status  Activity Tolerance: Patient tolerated treatment well Patient left: in chair;with call bell/phone within reach;with chair alarm set  OT Visit Diagnosis:  Unsteadiness on feet (R26.81)                Time: 3151-7616 OT Time Calculation (min): 40 min Charges:  OT General Charges $OT Visit: 1 Visit OT Evaluation $OT Eval Moderate Complexity: 1 Mod OT Treatments $Self Care/Home Management : 8-22 mins  Eber Jones., OTR/L Acute Rehabilitation Services Pager [phone] Office [phone]   Jeani Hawking M 10/07/2020, 10:39 AM

## 2020-10-07 NOTE — Progress Notes (Signed)
Pt to room 6N11 from 4N in low bed.  Pt high risk for fall since just recently fell at home.  Pt oriented to the room and dept, pt has visual deficit.

## 2020-10-07 NOTE — Progress Notes (Signed)
Inpatient Diabetes Program Recommendations  AACE/ADA: New Consensus Statement on Inpatient Glycemic Control (2015)  Target Ranges:  Prepandial:   less than 140 mg/dL      Peak postprandial:   less than 180 mg/dL (1-2 hours)      Critically ill patients:  140 - 180 mg/dL   Lab Results  Component Value Date   GLUCAP 173 (H) 10/07/2020   HGBA1C 8.4 (H) 10/06/2020    Review of Glycemic Control Results for Bradley Hall, Bradley Hall (MRN [id number]) as of 10/07/2020 10:45  Ref. Range 10/07/2020 03:55 10/07/2020 04:29 10/07/2020 08:14  Glucose-Capillary Latest Ref Range: 70 - 99 mg/dL 56 (L) 75 700 (H)   Diabetes history: Type 2 DM Outpatient Diabetes medications: Amaryl 4 mg QD, Metformin 850 mg BID Current orders for Inpatient glycemic control: Levemir 12 units BID, Novolog 0-20 Q4H  Inpatient Diabetes Program Recommendations:    Noted hypoglycemia of 53 mg/dL and renal status. Given lows, consider: -Decreasing correction to Novolog 0-9 units TID & HS -Decreasing Levemir to 10 units BID.  Thanks, Lujean Rave, MSN, RNC-OB Diabetes Coordinator [phone] (8a-5p)

## 2020-10-07 NOTE — TOC Initial Note (Signed)
Transition of Care [facility]) - Initial/Assessment Note    Patient Details  Name: Bradley Hall MRN: [id number] Date of Birth: 1966-01-06  [facility]) CM/SW Contact:    Bradley Chars, LCSW Phone Number: 10/07/2020, 3:15 PM  Clinical Narrative:    CSW met with pt to discuss discharge plan.  Pt declines to have referral for SNF and also declines to have referral for [facility].  Pt reports he was hospitalized in 2014 and did exercises by himself to recover.  He would like to "just do it on my own."  Permission given to speak with Bradley, Bradley Hall.  Pt reports PCP is Bradley Hall (not listed in epic)  Pt reports he has walker at home currently and would like a wheelchair as well.  Pt is vaccinated for covid.  CSW talked with pt about the PT recommendation and did leave choice document for pt, who agrees to this but says he can't see enough to read it, but will have his Bradley look at it.                Expected Discharge Plan: Home/Self Care Barriers to Discharge: Continued Medical Work up   Patient Goals and CMS Choice Patient states their goals for this hospitalization and ongoing recovery are:: "be able to walk" CMS Medicare.gov Compare Post Acute Care list provided to:: Patient Choice offered to / list presented to : Patient  Expected Discharge Plan and Services Expected Discharge Plan: Home/Self Care     Post Acute Care Choice: NA Living arrangements for the past 2 months: Single Family Home                                      Prior Living Arrangements/Services Living arrangements for the past 2 months: Single Family Home Lives with:: Spouse Patient language and need for interpreter reviewed:: Yes Do you feel safe going back to the place where you live?: Yes      Need for Family Participation in Patient Care: No (Comment) Care giver support system in place?: Yes (comment)   Criminal Activity/Legal Involvement Pertinent to Current Situation/Hospitalization: No -  Comment as needed  Activities of Daily Living Home Assistive Devices/Equipment: Walker (specify type) ADL Screening (condition at time of admission) Patient's cognitive ability adequate to safely complete daily activities?: Yes Is the patient deaf or have difficulty hearing?: No Does the patient have difficulty seeing, even when wearing glasses/contacts?: Yes Does the patient have difficulty concentrating, remembering, or making decisions?: No Patient able to express need for assistance with ADLs?: Yes Does the patient have difficulty dressing or bathing?: No Independently performs ADLs?: Yes (appropriate for developmental age) Does the patient have difficulty walking or climbing stairs?: Yes Weakness of Legs: Right Weakness of Arms/Hands: Both  Permission Sought/Granted Permission sought to share information with : Family Supports Permission granted to share information with : Yes, Verbal Permission Granted  Share Information with NAME: Bradley Hall           Emotional Assessment Appearance:: Appears stated age Attitude/Demeanor/Rapport: Engaged Affect (typically observed): Appropriate, Pleasant Orientation: : Oriented to Self, Oriented to Place, Oriented to  Time, Oriented to Situation Alcohol / Substance Use: Not Applicable Psych Involvement: No (comment)  Admission diagnosis:  Hemorrhagic shock ([place]) [R57.8] Hyperkalemia [E87.5] Injury of head, initial encounter [S09.90XA] Fall, initial encounter [W19.XXXA] Renal failure, unspecified chronicity [N19] Patient Active Problem List   Diagnosis Date  Noted  . Shock ([place]) 10/06/2020   PCP:  Patient, No Pcp Per Pharmacy:   [facility] [place], [place] [address] [place] [address] Phone: [phone] Fax: [phone]     Social Determinants of Health (SDOH) Interventions    Readmission Risk Interventions No flowsheet data found.

## 2020-10-08 LAB — COMPREHENSIVE METABOLIC PANEL
ALT: 39 U/L (ref 0–44)
AST: 40 U/L (ref 15–41)
Albumin: 3.5 g/dL (ref 3.5–5.0)
Alkaline Phosphatase: 58 U/L (ref 38–126)
Anion gap: 10 (ref 5–15)
BUN: 8 mg/dL (ref 6–20)
CO2: 25 mmol/L (ref 22–32)
Calcium: 9.1 mg/dL (ref 8.9–10.3)
Chloride: 97 mmol/L — ABNORMAL LOW (ref 98–111)
Creatinine, Ser: 1.3 mg/dL — ABNORMAL HIGH (ref 0.61–1.24)
GFR, Estimated: >60 mL/min (ref >60)
Glucose, Bld: 306 mg/dL — ABNORMAL HIGH (ref 70–99)
Potassium: 4 mmol/L (ref 3.5–5.1)
Sodium: 132 mmol/L — ABNORMAL LOW (ref 135–145)
Total Bilirubin: 0.5 mg/dL (ref 0.3–1.2)
Total Protein: 6.3 g/dL — ABNORMAL LOW (ref 6.5–8.1)

## 2020-10-08 LAB — CBC
HCT: 34.7 % — ABNORMAL LOW (ref 39.0–52.0)
Hemoglobin: 11.8 g/dL — ABNORMAL LOW (ref 13.0–17.0)
MCH: 30.4 pg (ref 26.0–34.0)
MCHC: 34 g/dL (ref 30.0–36.0)
MCV: 89.4 fL (ref 80.0–100.0)
Platelets: 189 10*3/uL (ref 150–400)
RBC: 3.88 MIL/uL — ABNORMAL LOW (ref 4.22–5.81)
RDW: 13.6 % (ref 11.5–15.5)
WBC: 8.6 10*3/uL (ref 4.0–10.5)
nRBC: 0 % (ref 0.0–0.2)

## 2020-10-08 LAB — GLUCOSE, CAPILLARY
Glucose-Capillary: 236 mg/dL — ABNORMAL HIGH (ref 70–99)
Glucose-Capillary: 207 mg/dL — ABNORMAL HIGH (ref 70–99)

## 2020-10-08 MED ORDER — THIAMINE HCL 100 MG PO TABS: 100.0000 mg | ORAL_TABLET | Freq: Every day | ORAL | 1 refills | Status: AC

## 2020-10-08 NOTE — Progress Notes (Signed)
Occupational Therapy Treatment Patient Details Name: Bradley Hall MRN: [id number] DOB: 10/19/1966 Today's Date: 10/08/2020    History of present illness 54 y/o male with PMHx of CVA x 3, T2DM, ? AUD, and ETOH abuse, who presented with a fall at home and admitted for undifferentiated shock. After fall pt was bleeding from head and layed down in bed for several hours prior to calling EMS. CT of head showed Soft tissue swelling/hemotoma near the vertex without underlying fx; No acute intracranial abnormality. 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   Continue to recommend higher level of care due to h/o falls, and impaired balance, however, pt is refusing SNF or HH follow up.  Will continue to follow. l  Follow Up Recommendations  SNF;Supervision/Assistance - 24 hour (Pt refusing follow up services )    Equipment Recommendations  3 in 1 bedside commode;Tub/shower bench    Recommendations for Other Services      Precautions / Restrictions Precautions Precautions: Fall Precaution Comments: Per chart, pt with h/o falls, however, pt denies this        Mobility Bed Mobility Overal bed mobility: Needs Assistance Bed Mobility: Supine to Sit;Sit to Supine     Supine to sit: Supervision Sit to supine: Supervision      Transfers Overall transfer level: Needs assistance Equipment used: 1 person hand held assist Transfers: Sit to/from [employer] Sit to Stand: Min assist Stand pivot transfers: Min assist       General transfer comment: min/HHA to steady     Balance Overall balance assessment: Needs  assistance Sitting-balance support: Feet supported Sitting balance-Leahy Scale: Good     Standing balance support: No upper extremity supported Standing balance-Leahy Scale: Fair Standing balance comment: able to maintain static standing with min guard assist                            ADL either performed or assessed with clinical judgement   ADL Overall ADL's : Needs assistance/impaired             Lower Body Bathing: Minimal assistance;Sit to/from stand       Lower Body Dressing: Minimal assistance;Sit to/from stand   Toilet Transfer: Minimal assistance;Ambulation;Comfort height toilet;Grab bars Toilet Transfer Details (indicate cue type and reason): min A/HHA Toileting- Clothing Manipulation and Hygiene: Sit to/from stand;Minimal assistance       Functional mobility during ADLs: Minimal assistance       Vision   Additional Comments: Pt with low vision.  No vision Lt eye, decreased vision Rt eye    Perception     Praxis      Cognition Arousal/Alertness: Awake/alert Behavior During Therapy: WFL for tasks assessed/performed Overall Cognitive Status: No family/caregiver present to determine baseline cognitive functioning                                 General Comments: Pt mildly impulsive.  He was able to recognize therapist from yesterday and recall details of yesterday's session         Exercises  Shoulder Instructions       General Comments Pt much more interactive today, and reports he feels much better and his balance is much improved.  He feels he is at baseline     Pertinent Vitals/ Pain       Pain Assessment: No/denies pain  Home Living                                          Prior Functioning/Environment              Frequency  Min 2X/week        Progress Toward Goals  OT Goals(current goals can now be found in the care plan section)  Progress towards OT goals: Progressing  toward goals     Plan Discharge plan remains appropriate (as pt likely requires more assist than is available )    Co-evaluation                 AM-PAC OT "6 Clicks" Daily Activity     Outcome Measure   Help from another person eating meals?: None Help from another person taking care of personal grooming?: A Little Help from another person toileting, which includes using toliet, bedpan, or urinal?: A Little Help from another person bathing (including washing, rinsing, drying)?: A Little Help from another person to put on and taking off regular upper body clothing?: A Little Help from another person to put on and taking off regular lower body clothing?: A Little 6 Click Score: 19    End of Session Equipment Utilized During Treatment: Gait belt  OT Visit Diagnosis: Unsteadiness on feet (R26.81)   Activity Tolerance Patient tolerated treatment well   Patient Left in bed;with call bell/phone within reach;with bed alarm set   Nurse Communication          Time: 5638-7564 OT Time Calculation (min): 13 min  Charges: OT General Charges $OT Visit: 1 Visit OT Treatments $Self Care/Home Management : 8-22 mins  Eber Jones., OTR/L Acute Rehabilitation Services Pager [phone] Office [phone]    Jeani Hawking M 10/08/2020, 9:25 AM

## 2020-10-08 NOTE — Discharge Summary (Signed)
[facility]  Physician Discharge Summary   Patient ID: Bradley Hall MRN: [id number] DOB/AGE: [birth year] 54 y.o.  Admit date: 10/06/2020 Discharge date: 10/08/2020  PCP: Patient, No Pcp Per  DISCHARGE DIAGNOSES:  Undifferentiated shock Acute blood loss anemia Aspiration pneumonia versus pneumonitis Acute renal failure, improved Hyperkalemia, resolved Alcohol use disorder Diabetes mellitus type 2 poorly controlled with hyperglycemia Essential hypertension Coronary artery disease History of stroke Vision impairment  RECOMMENDATIONS FOR OUTPATIENT FOLLOW UP: 1. Patient told to follow-up with the PCP within 1 week    Home Health: Declines.  Patient also declined skilled nursing facility placement Equipment/Devices: None  CODE STATUS: Full code  DISCHARGE CONDITION: fair  Diet recommendation: Modified carbohydrate  INITIAL HISTORY: Bradley Hall is a [age] y/o male with PMHx of CVA x 3, T2DM, ? AUD, who presented with a fall at home and admitted for undifferentiated shock.   Per EDP: Patient presents as a level 1 trauma secondary to Plavix, head injury, bleeding in his bed for day and being hypotensive at 70/50. Patient had a mechanical fall. Started bleeding and lay down in bed. Paramedics showed me a picture of the bed and it was significantly drenched with blood. Patient apparently was hemostatic on arrival with dried blood to his whole scalp and face. He is complaining of head pain, neck pain, shoulder pain and bilateral hip pain.  Per [name], patient had a mechanical fall at home and hit his head. He laid down in bed and noted he was bleeding heavily. Initially patient did not want to come to the ED. After several hours with the severity of bleeding and development of dizziness, patient agreed to be evaluated. Wife denies any stroke like symptoms prior to or after the fall. She endorses some alcohol use but unclear history. She denies any cold  symptoms. She notes a hx of proteinuria and elevated potassium in the past.   Contacted patient's PCP: Bradley Hall has a history of heavy alcohol use with history of withdrawals as well. Recent visit to their clinic on the 9th. Labs at that time showed: Normal kidney function with creatinine of 1.18 and BUN of 10. A1c was 11.1%. Patient's wife is an Char[employer] fundraiserN.  Consultations:  Critical care medicine  Procedures:  Blood transfusion   HOSPITAL COURSE:   Undifferentiated Shock:  Improved with transfusion. Lactate downtrending.   Acute Blood Loss Anemia Patient transfused 2 units of PRBC.  Hemoglobin stable.  No further bleeding noted.  Aspiration Pneumonia vs Pneumonitis  Noted on CT scan.  Patient without any respiratory symptoms.  Saturating normal on room air.  Denies any cough.  He was given 3 day course of ceftriaxone and azithromycin.  Cultures have been negative.  Acute Renal failure, non oliguric/Metabolic acidosis/Hyperkalemia Baseline creatinine of 1.1.  Presented with BUN of 32 and creatinine 4.18.  Rapidly improved with IV hydration.  Creatinine down to 1.30.  Has good urine output.  Potassium level improved with Lokelma.  Bicarbonate level also improved with infusion of sodium bicarbonate.    Alcohol Use Disorder Hx of heavy AUD with withdrawals per PCP.  He was started on CIWA protocol.  No withdrawal symptoms noted.  Thiamine.  Diabetes mellitus type 2, uncontrolled with hyperglycemia  Uncontrolled with last a1c of 11.1%.    HbA1c repeated here was 8.4.  He was initially started on long-acting insulin with SSI.  He experienced some hypoglycemic episodes.  Patient's compliance is not known.  He is noted to be on Metformin and Amaryl at  home.  He will be discharged and will be asked to resume those medications.  He will need to follow-up with PCP.  Hx of HTN/CAD Stable.  Hx of CVA Resume Plavix since hemoglobin has been stable.  For the most part patient is stable.  He does  have vision impairment for which he follows with ophthalmology. He was seen by PT and OT who recommended SNF.  He declines.  He wants to go home.  Blood work was done this morning which is stable for the most part.  Hyperglycemia is noted.  Patient also declines home health.  Seems to be stable for discharge today.  PERTINENT LABS:  The results of significant diagnostics from this hospitalization (including imaging, microbiology, ancillary and laboratory) are listed below for reference.    Microbiology: Recent Results (from the past 240 hour(s))  Respiratory Panel by RT PCR (Flu A&B, Covid) - Nasopharyngeal Swab     Status: None   Collection Time: 10/06/20  6:23 AM   Specimen: Nasopharyngeal Swab  Result Value Ref Range Status   SARS Coronavirus 2 by RT PCR NEGATIVE NEGATIVE Final    Comment: (NOTE) SARS-CoV-2 target nucleic acids are NOT DETECTED.  The SARS-CoV-2 RNA is generally detectable in upper respiratoy specimens during the acute phase of infection. The lowest concentration of SARS-CoV-2 viral copies this assay can detect is 131 copies/mL. A negative result does not preclude SARS-Cov-2 infection and should not be used as the sole basis for treatment or other patient management decisions. A negative result may occur with  improper specimen collection/handling, submission of specimen other than nasopharyngeal swab, presence of viral mutation(s) within the areas targeted by this assay, and inadequate number of viral copies (<131 copies/mL). A negative result must be combined with clinical observations, patient history, and epidemiological information. The expected result is Negative.  Fact Sheet for Patients:  https://www.moore.com/  Fact Sheet for Healthcare Providers:  https://www.young.biz/  This test is no t yet approved or cleared by the [place] FDA and  has been authorized for detection and/or diagnosis of SARS-CoV-2  by FDA under an Emergency Use Authorization (EUA). This EUA will remain  in effect (meaning this test can be used) for the duration of the COVID-19 declaration under Section 564(b)(1) of the Act, 21 U.S.C. section 360bbb-3(b)(1), unless the authorization is terminated or revoked sooner.     Influenza A by PCR NEGATIVE NEGATIVE Final   Influenza B by PCR NEGATIVE NEGATIVE Final    Comment: (NOTE) The Xpert Xpress SARS-CoV-2/FLU/RSV assay is intended as an aid in  the diagnosis of influenza from Nasopharyngeal swab specimens and  should not be used as a sole basis for treatment. Nasal washings and  aspirates are unacceptable for Xpert Xpress SARS-CoV-2/FLU/RSV  testing.  Fact Sheet for Patients: https://www.moore.com/  Fact Sheet for Healthcare Providers: https://www.young.biz/  This test is not yet approved or cleared by the [place] FDA and  has been authorized for detection and/or diagnosis of SARS-CoV-2 by  FDA under an Emergency Use Authorization (EUA). This EUA will remain  in effect (meaning this test can be used) for the duration of the  Covid-19 declaration under Section 564(b)(1) of the Act, 21  U.S.C. section 360bbb-3(b)(1), unless the authorization is  terminated or revoked. Performed at [facility], [address], [place], [place] [address]   Blood culture (routine x 2)     Status: None (Preliminary result)   Collection Time: 10/06/20  6:47 AM   Specimen: BLOOD RIGHT  ARM  Result Value Ref Range Status   Specimen Description BLOOD RIGHT ARM  Final   Special Requests   Final    BOTTLES DRAWN AEROBIC AND ANAEROBIC Blood Culture results may not be optimal due to an excessive volume of blood received in culture bottles   Culture   Final    NO GROWTH 2 DAYS Performed at [facility], [address] [place], [place] [address]    Report Status PENDING  Incomplete  Blood culture (routine x 2)     Status: None  (Preliminary result)   Collection Time: 10/06/20  7:00 AM   Specimen: BLOOD RIGHT HAND  Result Value Ref Range Status   Specimen Description BLOOD RIGHT HAND  Final   Special Requests   Final    BOTTLES DRAWN AEROBIC AND ANAEROBIC Blood Culture adequate volume   Culture   Final    NO GROWTH 2 DAYS Performed at [facility], [address], [place], [place] [address]    Report Status PENDING  Incomplete  MRSA PCR Screening     Status: None   Collection Time: 10/06/20  1:04 PM   Specimen: Nasal Mucosa; Nasopharyngeal  Result Value Ref Range Status   MRSA by PCR NEGATIVE NEGATIVE Final    Comment:        The GeneXpert MRSA Assay (FDA approved for NASAL specimens only), is one component of a comprehensive MRSA colonization surveillance program. It is not intended to diagnose MRSA infection nor to guide or monitor treatment for MRSA infections. Performed at [facility], [address] [place], [place] [address]      Labs:    Basic Metabolic Panel: Recent Labs  Lab 10/06/20 0556 10/06/20 1130 10/06/20 2109 10/07/20 0219 10/08/20 0849  NA 129* 135 137 139 132*  K 7.1* 4.3 4.0 3.8 4.0  CL 90* 101 99 101 97*  CO2 13* 20* GLUCOSE 434* 189* 141* 88 306*  BUN 32* 25* CREATININE 4.18* 2.41* 2.03* 1.50* 1.30*  CALCIUM 8.6* 7.9* 8.2* 8.6* 9.1  MG  --   --   --  1.7  --   PHOS  --   --   --  2.3*  --    Liver Function Tests: Recent Labs  Lab 10/06/20 0556 10/08/20 0849  AST 45* 40  ALT 43 39  ALKPHOS 62 58  BILITOT 0.8 0.5  PROT 6.5 6.3*  ALBUMIN 3.9 3.5   CBC: Recent Labs  Lab 10/06/20 0556 10/06/20 1612 10/06/20 2109 10/07/20 0219 10/08/20 0849  WBC 21.5*  --  13.6* 14.8* 8.6  HGB 9.0* 7.6* 10.1* 10.3* 11.8*  HCT 27.2* 21.8* 28.6* 29.9* 34.7*  MCV 93.8  --  88.0 87.2 89.4  PLT 255  --  164 162 189   Cardiac Enzymes: Recent Labs  Lab 10/06/20 0556  CKTOTAL 277    CBG: Recent Labs  Lab 10/07/20 1145  10/07/20 1541 10/07/20 2226 10/08/20 0822 10/08/20 1156  GLUCAP 105* 228* 250* 236* 207*     IMAGING STUDIES CT Head Wo Contrast  Result Date: 10/06/2020 CLINICAL DATA:  Head trauma. Abnormal mental status. Fall 24 hours ago. EXAM: CT HEAD WITHOUT CONTRAST TECHNIQUE: Contiguous axial images were obtained from the base of the skull through the vertex without intravenous contrast. COMPARISON:  CT head without contrast 05/11/2018 FINDINGS: Brain: Moderately advanced atrophy and white matter disease bilaterally is stable. Remote lacunar infarcts are again noted in  the basal ganglia, stable. Stable remote lacunar infarcts are present in the thalami bilaterally. No acute infarct, hemorrhage, or mass lesion is present. The ventricles are proportionate to the degree of atrophy. No significant extraaxial fluid collection is present. Vascular: Atherosclerotic calcifications are again noted within the cavernous internal carotid arteries bilaterally. No hyperdense vessel is present. Skull: Soft tissue swelling/hematoma is noted near the vertex. No underlying fracture is present. Sinuses/Orbits: Anterior right paranasal sinus disease is present. Fluid is present in the right maxillary sinus and inferior right frontal sinus. Mucosal thickening is present in the anterior ethmoid air cells, right greater than left. Minimal mucosal thickening is present in the left sphenoid sinus. The mastoid air cells are clear. Right lens replacement is noted since the prior exam. Focal soft tissue density is again noted in the posterior left globe. Question chronic retinal detachment. IMPRESSION: 1. Soft tissue swelling/hematoma near the vertex without underlying fracture. 2. No acute intracranial abnormality or significant interval change. 3. Stable moderately advanced atrophy and white matter disease. 4. Stable remote lacunar infarcts of the basal ganglia and thalami bilaterally. 5. Interval right lens replacement. 6. Stable focal  soft tissue density in the posterior left globe. Question chronic retinal detachment. Electronically Signed   By: Marin Roberts M.D.   On: 10/06/2020 06:20   CT Chest W Contrast  Result Date: 10/06/2020 CLINICAL DATA:  Chest trauma. Moderate to severe. Fall. Trauma to head. Patient is hypotensive. EXAM: CT CHEST, ABDOMEN, AND PELVIS WITH CONTRAST TECHNIQUE: Multidetector CT imaging of the chest, abdomen and pelvis was performed following the standard protocol during bolus administration of intravenous contrast. CONTRAST:  OMNIPAQUE IOHEXOL 300 MG/ML  SOLN COMPARISON:  TWO-VIEW CHEST X-RAY 05/11/2018. FINDINGS: CT CHEST FINDINGS Cardiovascular: Heart size is normal. Coronary artery calcifications are present. Atherosclerotic changes are noted at the aortic arch and great vessel origins without focal stenosis or change. No aneurysm is present. Pulmonary arteries are within normal limits. Mediastinum/Nodes: No enlarged mediastinal, hilar, or axillary lymph nodes. Thyroid gland, trachea, and esophagus demonstrate no significant findings. Lungs/Pleura: Patchy ill-defined airspace opacities are present in the right upper lobe and to a lesser extent in the superior segment of the lower lobes bilaterally. No significant consolidation is present. No focal contusion or pneumothorax is present. No significant pleural effusion is present. Musculoskeletal: Remote bilateral rib fractures are again noted. Vertebral body heights are maintained. Superior endplate Schmorl's node present at T10 on the left. No acute fractures are present. Chronic degenerative changes are noted at the manubriosternal joint with some associated sclerosis. CT ABDOMEN PELVIS FINDINGS Hepatobiliary: Diffuse fatty infiltration of the liver is noted. No discrete lesion is present. No contusion is present. The common bile duct and gallbladder are normal. Pancreas: Unremarkable. No pancreatic ductal dilatation or surrounding inflammatory  changes. Spleen: No splenic injury or perisplenic hematoma. Adrenals/Urinary Tract: No adrenal hemorrhage or renal injury identified. Bladder is unremarkable. Cortical thinning is noted posteriorly in the right kidney. Calcifications at the lower pole of the right kidney measure up to 7 mm, but are nonobstructive. The ureters are within normal limits bilaterally. The urinary bladder is within normal limits. Stomach/Bowel: Stomach is within normal limits. Appendix appears normal. No evidence of bowel wall thickening, distention, or inflammatory changes. Vascular/Lymphatic: Vascular calcifications are noted at the aorta and branch vessels without aneurysm. No significant adenopathy is present. Reproductive: Prostate is unremarkable. Other: No abdominal wall hernia or abnormality. No abdominopelvic ascites. Musculoskeletal: Vertebral body heights are maintained. Degenerative changes are noted at the  SI joints bilaterally with fusion on the right. Bony pelvis is otherwise unremarkable. The hips are located and within normal limits bilaterally. IMPRESSION: 1. No acute trauma to the chest, abdomen, or pelvis. 2. Patchy ill-defined airspace opacities in the right upper lobe and to a lesser extent in the superior segment of the lower lobes bilaterally are concerning for infection. 3. Remote bilateral rib fractures. 4. Chronic degenerative changes at the manubriosternal joint without acute fracture. 5. Hepatic steatosis. 6. Nonobstructing stones at the lower pole of the right kidney. 7. Aortic Atherosclerosis (ICD10-I70.0). These results were called by telephone at the time of interpretation on 10/06/2020 at 6:28am to provider CORNETT, who verbally acknowledged these results. Electronically Signed   By: Marin Roberts M.D.   On: 10/06/2020 06:34   CT Cervical Spine Wo Contrast  Result Date: 10/06/2020 CLINICAL DATA:  Chest trauma.  Trauma to head.  Fall.  Head pain. EXAM: CT CERVICAL SPINE WITHOUT CONTRAST  TECHNIQUE: Multidetector CT imaging of the cervical spine was performed without intravenous contrast. Multiplanar CT image reconstructions were also generated. COMPARISON:  CT a of the neck 05/12/2018 FINDINGS: Alignment: No significant listhesis is present. Skull base and vertebrae: Craniocervical junction is normal. Mild degenerative changes C1-2 are stable. Vertebral body heights are normal. No acute or healing fracture is present. Soft tissues and spinal canal: No prevertebral fluid or swelling. No visible canal hematoma. Disc levels: Minimal degenerative changes are present, most evident at C2-3. No significant change is present. Mandible is located. Visualized portions are intact. The patient is edentulous. Upper chest: Lung apices are clear. Thoracic inlet is within normal limits. IMPRESSION: 1. No acute fracture or traumatic subluxation. 2. Minimal degenerative changes are stable. Electronically Signed   By: Marin Roberts M.D.   On: 10/06/2020 06:25   CT ABDOMEN PELVIS W CONTRAST  Result Date: 10/06/2020 CLINICAL DATA:  Chest trauma. Moderate to severe. Fall. Trauma to head. Patient is hypotensive. EXAM: CT CHEST, ABDOMEN, AND PELVIS WITH CONTRAST TECHNIQUE: Multidetector CT imaging of the chest, abdomen and pelvis was performed following the standard protocol during bolus administration of intravenous contrast. CONTRAST:  OMNIPAQUE IOHEXOL 300 MG/ML  SOLN COMPARISON:  TWO-VIEW CHEST X-RAY 05/11/2018. FINDINGS: CT CHEST FINDINGS Cardiovascular: Heart size is normal. Coronary artery calcifications are present. Atherosclerotic changes are noted at the aortic arch and great vessel origins without focal stenosis or change. No aneurysm is present. Pulmonary arteries are within normal limits. Mediastinum/Nodes: No enlarged mediastinal, hilar, or axillary lymph nodes. Thyroid gland, trachea, and esophagus demonstrate no significant findings. Lungs/Pleura: Patchy ill-defined airspace opacities are  present in the right upper lobe and to a lesser extent in the superior segment of the lower lobes bilaterally. No significant consolidation is present. No focal contusion or pneumothorax is present. No significant pleural effusion is present. Musculoskeletal: Remote bilateral rib fractures are again noted. Vertebral body heights are maintained. Superior endplate Schmorl's node present at T10 on the left. No acute fractures are present. Chronic degenerative changes are noted at the manubriosternal joint with some associated sclerosis. CT ABDOMEN PELVIS FINDINGS Hepatobiliary: Diffuse fatty infiltration of the liver is noted. No discrete lesion is present. No contusion is present. The common bile duct and gallbladder are normal. Pancreas: Unremarkable. No pancreatic ductal dilatation or surrounding inflammatory changes. Spleen: No splenic injury or perisplenic hematoma. Adrenals/Urinary Tract: No adrenal hemorrhage or renal injury identified. Bladder is unremarkable. Cortical thinning is noted posteriorly in the right kidney. Calcifications at the lower pole of the right kidney measure  up to 7 mm, but are nonobstructive. The ureters are within normal limits bilaterally. The urinary bladder is within normal limits. Stomach/Bowel: Stomach is within normal limits. Appendix appears normal. No evidence of bowel wall thickening, distention, or inflammatory changes. Vascular/Lymphatic: Vascular calcifications are noted at the aorta and branch vessels without aneurysm. No significant adenopathy is present. Reproductive: Prostate is unremarkable. Other: No abdominal wall hernia or abnormality. No abdominopelvic ascites. Musculoskeletal: Vertebral body heights are maintained. Degenerative changes are noted at the SI joints bilaterally with fusion on the right. Bony pelvis is otherwise unremarkable. The hips are located and within normal limits bilaterally. IMPRESSION: 1. No acute trauma to the chest, abdomen, or pelvis. 2.  Patchy ill-defined airspace opacities in the right upper lobe and to a lesser extent in the superior segment of the lower lobes bilaterally are concerning for infection. 3. Remote bilateral rib fractures. 4. Chronic degenerative changes at the manubriosternal joint without acute fracture. 5. Hepatic steatosis. 6. Nonobstructing stones at the lower pole of the right kidney. 7. Aortic Atherosclerosis (ICD10-I70.0). These results were called by telephone at the time of interpretation on 10/06/2020 at 6:28am to provider CORNETT, who verbally acknowledged these results. Electronically Signed   By: Marin Roberts M.D.   On: 10/06/2020 06:34   DG Pelvis Portable  Result Date: 10/06/2020 CLINICAL DATA:  Level 1 trauma.  Fall.  Hypotensive. EXAM: PORTABLE PELVIS 1-2 VIEWS COMPARISON:  None. FINDINGS: No acute or healing fractures are present. Calcifications are present along the vas deferens. IMPRESSION: 1. No acute or healing fractures. 2. Calcifications along the vas deferens. Electronically Signed   By: Marin Roberts M.D.   On: 10/06/2020 06:14   DG Chest Portable 1 View  Result Date: 10/06/2020 CLINICAL DATA:  Head trauma.  Patient found down.  Trauma to head. EXAM: PORTABLE CHEST 1 VIEW COMPARISON:  None. FINDINGS: Heart size is normal. Lungs are clear. Atherosclerotic changes are noted at the aortic arch. No edema or effusion is present. Remote bilateral rib fractures are present. No acute fractures are present. IMPRESSION: 1. No acute cardiopulmonary disease. 2. Remote bilateral rib fractures. 3. Atherosclerosis. Electronically Signed   By: Marin Roberts M.D.   On: 10/06/2020 06:13    DISCHARGE EXAMINATION: Vitals:   10/08/20 0203 10/08/20 0443 10/08/20 0823 10/08/20 1157  BP: 133/83 (!) 156/81 (!) 163/86 139/80  Pulse: 87 91 (!) 102 85  Resp: 18 18 18 18   Temp: 98.3 F (36.8 C) 98.3 F (36.8 C) 98.7 F (37.1 C) 98.4 F (36.9 C)  TempSrc: Oral Oral Oral Oral  SpO2: 100%  100% 100% 100%  Weight:  59 kg    Height:       General appearance: Awake alert.  In no distress Resp: Clear to auscultation bilaterally.  Normal effort Cardio: S1-S2 is normal regular.  No S3-S4.  No rubs murmurs or bruit GI: Abdomen is soft.  Nontender nondistended.  Bowel sounds are present normal.  No masses organomegaly    DISPOSITION: Home  Discharge Instructions    Call MD for:  difficulty breathing, headache or visual disturbances   Complete by: As directed    Call MD for:  extreme fatigue   Complete by: As directed    Call MD for:  persistant dizziness or light-headedness   Complete by: As directed    Call MD for:  persistant nausea and vomiting   Complete by: As directed    Call MD for:  severe uncontrolled pain   Complete by: As directed  Call MD for:  temperature >100.4   Complete by: As directed    Diet - low sodium heart healthy   Complete by: As directed    Discharge instructions   Complete by: As directed    Please follow-up with your primary care provider in 1 week for further management of your diabetes.  You might benefit from being started on insulin.  Please seek attention immediately if your symptoms were to recur.  If you change your mind regarding home health please contact your primary care provider.  You were cared for by a hospitalist during your hospital stay. If you have any questions about your discharge medications or the care you received while you were in the hospital after you are discharged, you can call the unit and asked to speak with the hospitalist on call if the hospitalist that took care of you is not available. Once you are discharged, your primary care physician will handle any further medical issues. Please note that NO REFILLS for any discharge medications will be authorized once you are discharged, as it is imperative that you return to your primary care physician (or establish a relationship with a primary care physician if you do not  have one) for your aftercare needs so that they can reassess your need for medications and monitor your lab values. If you do not have a primary care physician, you can call [phone] for a physician referral.   Increase activity slowly   Complete by: As directed    No wound care   Complete by: As directed         Allergies as of 10/08/2020   No Known Allergies     Medication List    TAKE these medications   clopidogrel 75 MG tablet Commonly known as: PLAVIX Take 75 mg by mouth daily.   enalapril 10 MG tablet Commonly known as: VASOTEC Take 10 mg by mouth daily.   glimepiride 4 MG tablet Commonly known as: AMARYL Take 4 mg by mouth daily.   metFORMIN 850 MG tablet Commonly known as: GLUCOPHAGE Take 850 mg by mouth 2 (two) times daily.   rosuvastatin 20 MG tablet Commonly known as: CRESTOR Take 20 mg by mouth at bedtime.   thiamine 100 MG tablet Take 1 tablet (100 mg total) by mouth daily.         Follow-up Information    Ralene Ok, MD. Schedule an appointment as soon as possible for a visit in 1 week(s).   Specialty: Internal Medicine Contact information: [address] [place] [address] [phone]               TOTAL DISCHARGE TIME: 35 minutes  Giavanni Zeitlin Rito Ehrlich  [facility] Pager on [email or web address]  10/08/2020, 2:00 PM

## 2020-10-08 NOTE — TOC Initial Note (Signed)
Transition of Care [facility]) - Initial/Assessment Note    Patient Details  Name: Bradley Hall MRN: [id number] Date of Birth: 01/03/1966  [facility]) CM/SW Contact:    Kingsley Plan, RN Phone Number: 10/08/2020, 11:49 AM  Clinical Narrative:                 Patient from home with wife. Patient already has a walker at home.   Discussed PT recommendation for short tern SNF placement , patient declined. NCM offered to arrange home health PT , patient also declined.   NCM discussed OP PT , patient declined.   If patient changes his mind he will call PCP Dr Ralene Ok to arrange.  Expected Discharge Plan: Home w Home Health Services Barriers to Discharge: Continued Medical Work up   Patient Goals and CMS Choice Patient states their goals for this hospitalization and ongoing recovery are:: to return to home CMS Medicare.gov Compare Post Acute Care list provided to:: Patient Choice offered to / list presented to : Patient  Expected Discharge Plan and Services Expected Discharge Plan: Home w Home Health Services   Discharge Planning Services: CM Consult Post Acute Care Choice: Home Health Living arrangements for the past 2 months: Single Family Home Expected Discharge Date: 10/08/20                 DME Agency: NA       HH Arranged: PT, Refused SNF, Refused HH          Prior Living Arrangements/Services Living arrangements for the past 2 months: Single Family Home Lives with:: Spouse Patient language and need for interpreter reviewed:: Yes Do you feel safe going back to the place where you live?: Yes      Need for Family Participation in Patient Care: Yes (Comment) Care giver support system in place?: Yes (comment) Current home services: DME (walker) Criminal Activity/Legal Involvement Pertinent to Current Situation/Hospitalization: No - Comment as needed  Activities of Daily Living Home Assistive Devices/Equipment: Walker (specify type) ADL  Screening (condition at time of admission) Patient's cognitive ability adequate to safely complete daily activities?: Yes Is the patient deaf or have difficulty hearing?: No Does the patient have difficulty seeing, even when wearing glasses/contacts?: Yes Does the patient have difficulty concentrating, remembering, or making decisions?: No Patient able to express need for assistance with ADLs?: Yes Does the patient have difficulty dressing or bathing?: No Independently performs ADLs?: Yes (appropriate for developmental age) Does the patient have difficulty walking or climbing stairs?: Yes Weakness of Legs: Right Weakness of Arms/Hands: Both  Permission Sought/Granted Permission sought to share information with : Family Supports Permission granted to share information with : No  Share Information with NAME: wife [name]           Emotional Assessment Appearance:: Appears stated age Attitude/Demeanor/Rapport: Engaged Affect (typically observed): Accepting Orientation: : Oriented to Self, Oriented to Place, Oriented to  Time, Oriented to Situation Alcohol / Substance Use: Not Applicable Psych Involvement: No (comment)  Admission diagnosis:  Hemorrhagic shock (HCC) [R57.8] Hyperkalemia [E87.5] Injury of head, initial encounter [S09.90XA] Fall, initial encounter [W19.XXXA] Renal failure, unspecified chronicity [N19] Patient Active Problem List   Diagnosis Date Noted  . Shock (HCC) 10/06/2020   PCP:  Patient, No Pcp Per Pharmacy:   Karin Golden at [facility] [address], [place] - [address] [facility] [address] [place] [address] Phone: [phone] Fax: [phone]     Social Determinants of Health (  SDOH) Interventions    Readmission Risk Interventions No flowsheet data found.

## 2020-10-08 NOTE — TOC CAGE-AID Note (Signed)
Transition of Care [facility]) - CAGE-AID Screening   Patient Details  Name: Bradley Hall MRN: [id number] Date of Birth: 02/01/1966  Transition of Care [facility]) CM/SW Contact:    Emeterio Reeve, [place] Phone Number: 10/08/2020, 2:27 PM   Clinical Narrative:  CSW met with pt at bedside. CSW introduced self and explained her role at the hospital.  Pt became agitated, stating that he does not have a problem with alcohol and he doesn't know why people keep bringing it up. PT states he might have a beer 2-3 times a month. Pt states he does not use substances. Pt declined resources.   CAGE-AID Screening:    Have You Ever Felt You Ought to Cut Down on Your Drinking or Drug Use?: No Have People Annoyed You By Critizing Your Drinking Or Drug Use?: No Have You Felt Bad Or Guilty About Your Drinking Or Drug Use?: No Have You Ever Had a Drink or Used Drugs First Thing In The Morning to Steady Your Nerves or to Get Rid of a Hangover?: No CAGE-AID Score: 0  Substance Abuse Education Offered: Yes    Blima Ledger, [place] Social Worker [phone]

## 2020-10-08 NOTE — Progress Notes (Signed)
Patient agreeable with discharge today. All lines removed. Discharge instructions printed and discussed with patient and his wife. Patient transported to vehicle via wheelchair.

## 2020-10-11 LAB — CULTURE, BLOOD (ROUTINE X 2)
Culture: NO GROWTH
Culture: NO GROWTH
Special Requests: ADEQUATE

## 2020-10-14 ENCOUNTER — Encounter (HOSPITAL_COMMUNITY): Payer: Self-pay | Admitting: Emergency Medicine

## 2022-04-26 IMAGING — DX DG CHEST 1V PORT
1 series · 1 of 1 positions shown · non-contrast
Comparison: None.

CLINICAL DATA: Head trauma.  Patient found down.  Trauma to head.

EXAM:
PORTABLE CHEST 1 VIEW

[chest]
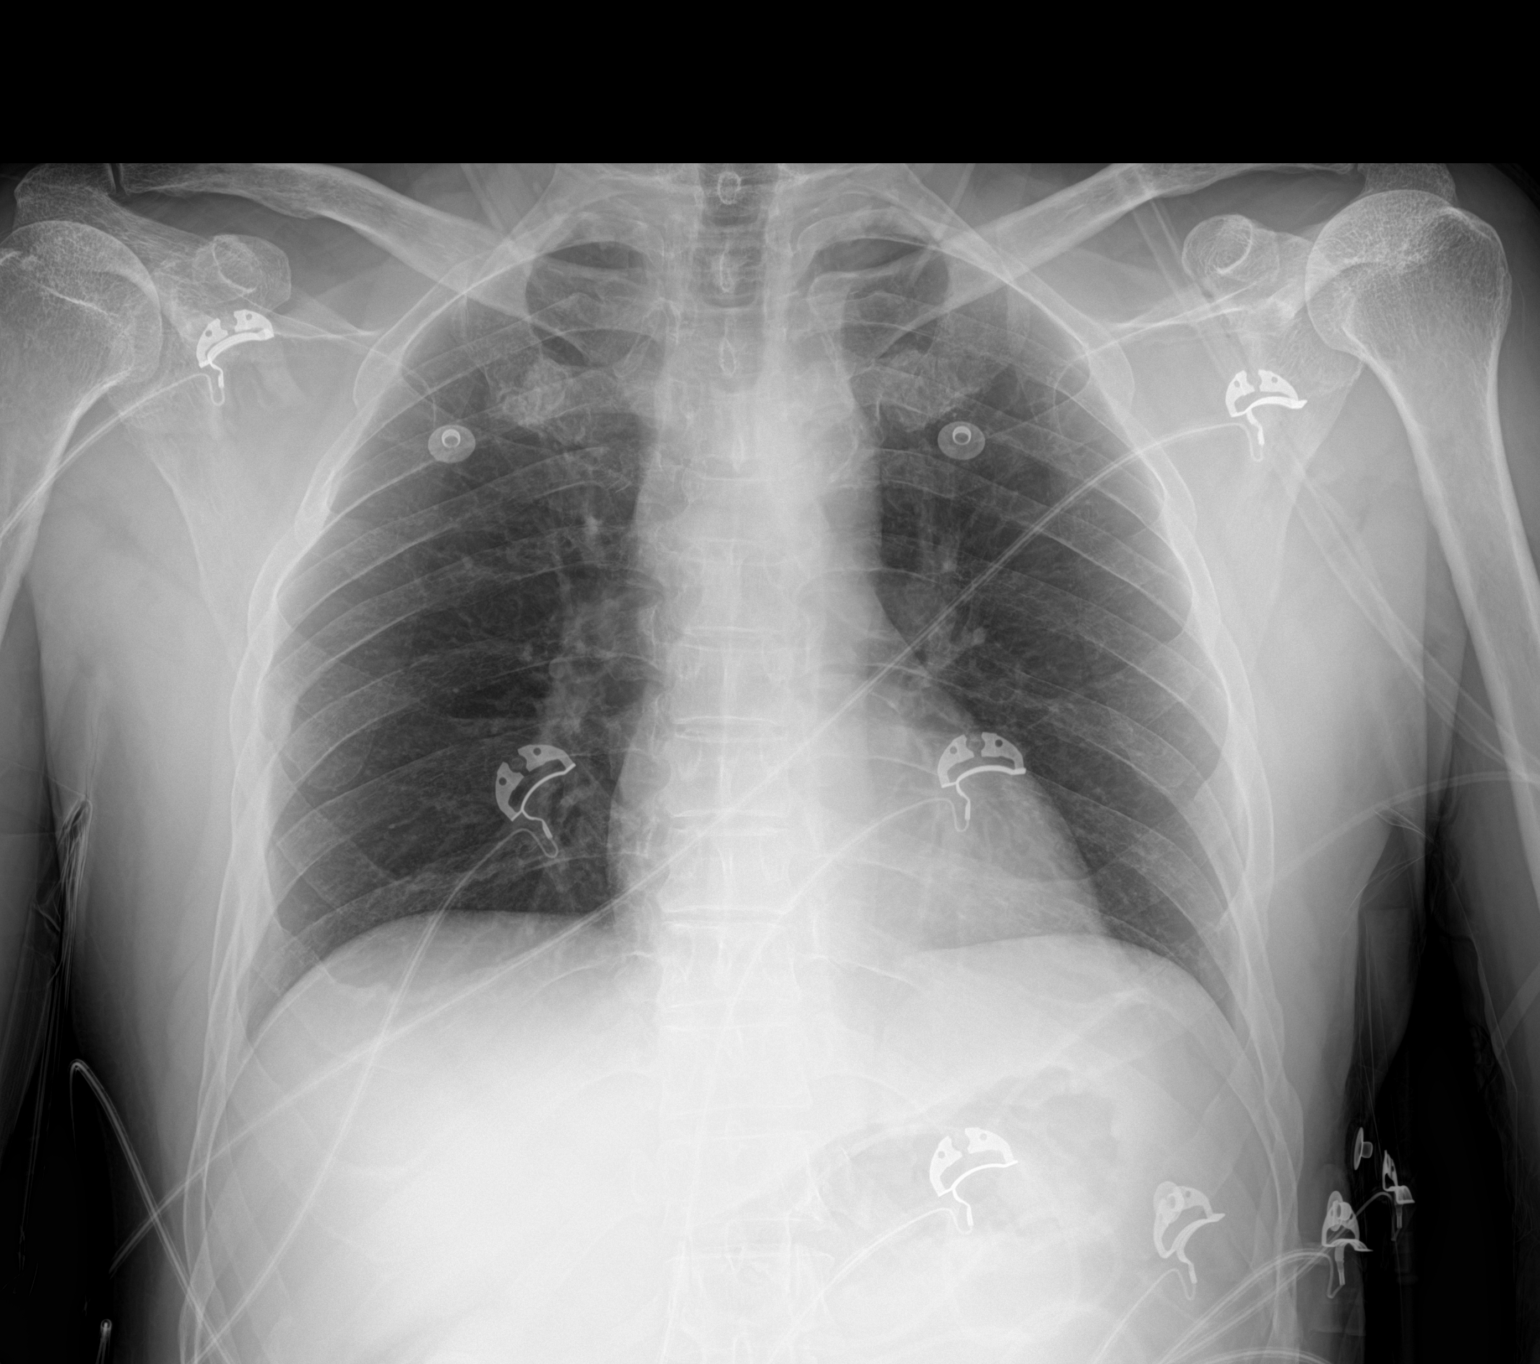

[1 of 1 positions shown; findings below may reference images not displayed]

FINDINGS: Heart size is normal. Lungs are clear. Atherosclerotic changes are
noted at the aortic arch. No edema or effusion is present. Remote
bilateral rib fractures are present. No acute fractures are present.
IMPRESSION: 1. No acute cardiopulmonary disease.
2. Remote bilateral rib fractures.
3. Atherosclerosis.
